# Patient Record
Sex: Female | Born: 1950
Health system: Southern US, Community
[De-identification: ages and names within clinical notes are randomized; demographics above are authoritative.]

## PROBLEM LIST (undated history)

## (undated) DIAGNOSIS — G43909 Migraine, unspecified, not intractable, without status migrainosus: Secondary | ICD-10-CM

## (undated) DIAGNOSIS — F419 Anxiety disorder, unspecified: Secondary | ICD-10-CM

## (undated) DIAGNOSIS — Z780 Asymptomatic menopausal state: Secondary | ICD-10-CM

## (undated) DIAGNOSIS — K5790 Diverticulosis of intestine, part unspecified, without perforation or abscess without bleeding: Secondary | ICD-10-CM

## (undated) HISTORY — DX: Anxiety disorder, unspecified: F41.9

## (undated) HISTORY — DX: Diverticulosis of intestine, part unspecified, without perforation or abscess without bleeding: K57.90

## (undated) HISTORY — DX: Migraine, unspecified, not intractable, without status migrainosus: G43.909

## (undated) HISTORY — DX: Asymptomatic menopausal state: Z78.0

---

## 1998-08-20 ENCOUNTER — Other Ambulatory Visit: Admission: RE | Admit: 1998-08-20 | Discharge: 1998-08-20 | Payer: Self-pay | Admitting: Gynecology

## 1998-10-09 ENCOUNTER — Other Ambulatory Visit: Admission: RE | Admit: 1998-10-09 | Discharge: 1998-10-09 | Payer: Self-pay | Admitting: Gynecology

## 2000-03-01 ENCOUNTER — Other Ambulatory Visit: Admission: RE | Admit: 2000-03-01 | Discharge: 2000-03-01 | Payer: Self-pay | Admitting: Gynecology

## 2000-03-15 ENCOUNTER — Encounter (INDEPENDENT_AMBULATORY_CARE_PROVIDER_SITE_OTHER): Payer: Self-pay | Admitting: Specialist

## 2000-03-15 ENCOUNTER — Other Ambulatory Visit: Admission: RE | Admit: 2000-03-15 | Discharge: 2000-03-15 | Payer: Self-pay | Admitting: Gynecology

## 2000-09-16 ENCOUNTER — Encounter (INDEPENDENT_AMBULATORY_CARE_PROVIDER_SITE_OTHER): Payer: Self-pay

## 2000-09-16 ENCOUNTER — Ambulatory Visit (HOSPITAL_COMMUNITY): Admission: RE | Admit: 2000-09-16 | Discharge: 2000-09-16 | Payer: Self-pay | Admitting: Gynecology

## 2000-10-24 ENCOUNTER — Encounter (INDEPENDENT_AMBULATORY_CARE_PROVIDER_SITE_OTHER): Payer: Self-pay | Admitting: Specialist

## 2000-10-24 ENCOUNTER — Inpatient Hospital Stay (HOSPITAL_COMMUNITY): Admission: RE | Admit: 2000-10-24 | Discharge: 2000-10-25 | Payer: Self-pay | Admitting: Gynecology

## 2002-02-14 ENCOUNTER — Emergency Department (HOSPITAL_COMMUNITY): Admission: EM | Admit: 2002-02-14 | Discharge: 2002-02-14 | Payer: Self-pay | Admitting: Emergency Medicine

## 2004-06-22 ENCOUNTER — Other Ambulatory Visit: Admission: RE | Admit: 2004-06-22 | Discharge: 2004-06-22 | Payer: Self-pay | Admitting: Gynecology

## 2004-11-01 ENCOUNTER — Emergency Department (HOSPITAL_COMMUNITY): Admission: EM | Admit: 2004-11-01 | Discharge: 2004-11-01 | Payer: Self-pay | Admitting: Emergency Medicine

## 2005-11-12 ENCOUNTER — Emergency Department (HOSPITAL_COMMUNITY): Admission: EM | Admit: 2005-11-12 | Discharge: 2005-11-12 | Payer: Self-pay | Admitting: Family Medicine

## 2005-12-21 ENCOUNTER — Ambulatory Visit: Admission: RE | Admit: 2005-12-21 | Discharge: 2005-12-21 | Payer: Self-pay | Admitting: Internal Medicine

## 2006-04-21 ENCOUNTER — Encounter: Admission: RE | Admit: 2006-04-21 | Discharge: 2006-04-21 | Payer: Self-pay | Admitting: Gynecology

## 2008-09-07 ENCOUNTER — Emergency Department (HOSPITAL_COMMUNITY): Admission: EM | Admit: 2008-09-07 | Discharge: 2008-09-07 | Payer: Self-pay | Admitting: Emergency Medicine

## 2008-12-12 ENCOUNTER — Ambulatory Visit (HOSPITAL_COMMUNITY): Admission: RE | Admit: 2008-12-12 | Discharge: 2008-12-12 | Payer: Self-pay | Admitting: Internal Medicine

## 2010-11-10 LAB — DIFFERENTIAL
Basophils Relative: 2 % — ABNORMAL HIGH (ref 0–1)
Eosinophils Relative: 1 % (ref 0–5)
Lymphocytes Relative: 21 % (ref 12–46)
Neutro Abs: 4.5 10*3/uL (ref 1.7–7.7)
Neutrophils Relative %: 69 % (ref 43–77)

## 2010-11-10 LAB — COMPREHENSIVE METABOLIC PANEL
ALT: 13 U/L (ref 0–35)
Albumin: 3.7 g/dL (ref 3.5–5.2)
Chloride: 104 mEq/L (ref 96–112)
Creatinine, Ser: 0.96 mg/dL (ref 0.4–1.2)
GFR calc Af Amer: >60 mL/min (ref >60)
Total Protein: 6.6 g/dL (ref 6.0–8.3)

## 2010-11-10 LAB — CBC
Platelets: 223 10*3/uL (ref 150–400)
RBC: 4.54 MIL/uL (ref 3.87–5.11)
RDW: 13.3 % (ref 11.5–15.5)

## 2010-12-18 NOTE — H&P (Signed)
[facility]  Patient:    Julie Kaiser, Julie Kaiser                     MRN: [id number] Adm. Date:  [id number] Disc. Date: [id number] Attending:  Susa Raring                         History and Physical  HISTORY:                      This patient is a 60 year old, gravida 2, para 2, admitted to the hospital because of persistent heavy vaginal bleeding and anemia.  This first started the summer of 2001, approximately.  She had an endometrial biopsy which was benign.  She was given Ponstel with little success.  She was also tried on oral hormonal therapy.  This was unsuccessful. She became anemic, was placed on iron, and she responded.  However, in January and February of this year, the bleeding became worse, and she is scheduled for a D&C and hysteroscopy.  This was performed.  She was found to have endometrial polyps and also what looked like an intracavitary leiomyoma.  She was again placed on oral contraceptives after the [facility]; however, she continued to have very heavy periods.  She was given Lupron which finally stopped her bleeding.  She is now admitted for definitive therapy of this problem.  Pap smear is normal.  Last mammogram was normal.  Last menstrual period prior to this admission was September 29, 2000.  Since the patient is nearly 60 years old, she has also elected to have her ovaries removed at the same time and go on estrogen replacement therapy indefinitely.  PAST MEDICAL HISTORY:         The patient had the usual childhood diseases. She had two pregnancies which went to term.  She also has one adopted child. 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  She had a postpartum tubal after her last pregnancy.  She denies drug use, minimal alcohol use.  ALLERGIES:                    TETRACYCLINE.  No other allergies.  FAMILY HISTORY:               There is no history of bleeding disorders; tuberculosis; colon, breast, or ovarian  cancers.  SYSTEM REVIEW:                ENT:  No complaints.  CARDIORESPIRATORY:  No complaints.  GI:  No complaints.  GENITOURINARY:  See present illness. NEUROMUSCULAR:  No complaints.  PHYSICAL EXAMINATION:  GENERAL:                      This is a well-nourished, well-developed 60 year old female in no acute distress.  She is 5 feet 6 inches tall, weighs approximately 160 lbs.  VITAL SIGNS:                  Blood pressure 100/70, pulse 70 and regular, respirations 12 per minute.  HEAD/NECK:                    Pupils equal, reactive to light and accommodation.  EARS/NOSE/THROAT:             Normal thyroids, normal size.  HEART:  Rhythm regular.  No murmurs heard.  LUNGS:                        Clear to auscultation and percussion.  BREASTS:                      No palpable masses.  ABDOMEN:                      Soft and flat.  No organs are palpable.  There is no tenderness.  Bowel sounds are present.  SKIN:                         Smooth and dry.  EXTREMITIES:                  No deformities.  No limitation of motion. Peripheral pulses are present bilaterally.  NEUROLOGIC:                   Physiologic.  PELVIC:                       External genitalia normal with a parous outlet. Vagina is lined with healthy mucosa.  Bladder and rectum are pretty well supported.  Cervix is smooth and well supported.  The uterus itself is anterior.  It is slightly enlarged at 8-10 weeks size.  No adnexal masses can be palpated.  The cul-de-sac is free of masses.  RECTAL:                       Negative.  Hemoccult negative.  IMPRESSION:                   1. Uterine myomata with intracavitary leiomyoma.                               2. Menorrhagia.                               3. Anemia secondary to blood loss or postpartum                                  status of bilateral tubal sterilization.  PLAN AND MANAGEMENT:          The patient will go to the  operating room where a total abdominal hysterectomy and bilateral salpingo-oophorectomy will be performed.  She will be put on hormonal replacement therapy following that. The patient understands that she will no longer have periods and no longer be able to have children.  She understands the hazards of pelvic surgery, including, but not limited to, bleeding, infection, injure to adjacent structures, and death.  The patient agrees to undergo this procedure. DD:  10/24/00 TD:  10/24/00 Job: [id number]

## 2010-12-18 NOTE — Op Note (Signed)
[facility]  Patient:    Julie Kaiser, Julie Kaiser                       MRN: [id number] Proc. Date: 09/16/00 Attending:  Luvenia Redden, M.D.                           Operative Report  PREOPERATIVE DIAGNOSES: 1. Menorrhagia. 2. Metrorrhagia. 3. Enlarged uterus probably due to fibroids.  POSTOPERATIVE DIAGNOSES: 1. Menorrhagia. 2. Metrorrhagia. 3. Enlarged uterus probably due to fibroids. 4. Endometrial polyps. 5. Intracavitary leiomyoma.  OPERATION:  Hysteroscopy, dilatation and curettage.  SURGEON:  Luvenia Redden, M.D.  DESCRIPTION OF PROCEDURE:   The patient was placed in the lithotomy position and given IV sedation.  Paracervical block was performed by injecting 1% lidocaine at the 4 and 8 oclock positions paracervically.  Examination revealed the uterus to be anterior and about the size of a 10-12 week pregnancy.  No adnexal masses could be palpated.  The uterus sounded to a depth of 5 inches.  The cervix was dilated, and the scope inserted into the endocervical canal.  Using sorbitol as a distending medium, the endocervical canal appeared normal as it was viewed.  The scope was advanced and upon entering the endometrial cavity, there was a fair amount of blood.  This was eventually evacuated and washed free.  Contamination showed multiple endometrial polyps extending close to the posterior and anterior wall.  There was also what appeared to be a large intracavitary leiomyoma present directly towards the fundus of the uterus and as near as I could tell this was emanating from the fundus.  This was fairly large and I could only estimate it being anywhere from 3-4 cm in diameter.  Since the patient had not been pretreated with Lupron, resection of the myoma was not attempted.  The scope was withdrawn, and the cervical curettage was done, and this was sent as a separate specimen.  Thorough endometrial curettage was done.  There was a fair amount  of polypoid appearing material obtained.  The endometrial cavity was wiped with a dry sponge.  The procedure was terminated.  There was minimal fluid deficit.  Approximately 60 cc was reported, however, there was at least that much fluid on the floor.  Blood loss was minimal, and the patient tolerated the procedure well.  She was removed to recovery in good condition. DD:  09/16/00 TD:  09/16/00 Job: [id number]

## 2010-12-18 NOTE — Discharge Summary (Signed)
[facility] of [facility]  Patient:    Julie Kaiser, Julie Kaiser                     MRN: [id number] Adm. Date:  [id number] Disc. Date: [id number] Attending:  Susa Raring                           Discharge Summary                                Patient is a 60 year old female admitted to the hospital because of heavy periods and a uterine myoma that is unresponsive to hormonal therapy and a D&C.  She became anemic because of her bleeding.  She was given Lupron and put on iron.  The patient is now admitted for a hysterectomy for definitive treatment.  PHYSICAL EXAMINATION  PELVIC:                       External genitalia was normal.  She had a ______ outlet.  The vagina was lined with healthy mucosa.  The bladder and rectum were well supported.  The cervix was smooth and well supported.  Uterus is anterior and slightly enlarged and irregular at 8-[redacted] weeks gestational size.  No adnexal masses could be palpated and the cul-de-sac was free of masses.  LABORATORIES:                 Preoperative hemoglobin 10.2, hematocrit 32, white count and differential were within normal limits.  Postoperative hemoglobin 8.5, hematocrit 26.9.  Urinalysis normal for a voided specimen. Pregnancy test was negative.  HOSPITAL COURSE:              Patient was taken to the operating room where a total abdominal hysterectomy, bilateral salpingo-oophorectomy was done without complications.  Blood loss was about 100 cc.  None was replaced.  Patient tolerated the procedure well.  First postoperative day she was afebrile.  She had stable vital signs.  The catheter was removed and she was able to void spontaneously March 26.  Later on that same day the patient was ambulating, passing gas, and taking p.o. nourishment.  The staples were removed from her wound and Steri-Strips were applied.  She was instructed to do limited activity, no vaginal penetration, and to return to my office in three  weeks time for followup care.  She was given a prescription for [facility] for pain and Premarin 0.625 mg to be taken daily.  Examination of the tissue removed showed that she had some intramural and subserosal leiomyomata, multiple ones.  She also had some endosalpingiosis of the tubes.  There was no malignancy.  FINAL DIAGNOSES:              1. Multiple uterine myomata.                               2. Menorrhagia.                               3. Anemia secondary to blood loss.                               4. Endosalpingiosis.  PROCEDURE:  Total abdominal hysterectomy, bilateral salpingo-oophorectomy.  CONDITION ON DISCHARGE:       Improved. DD:  12/13/00 TD:  12/13/00 Job: [id number]

## 2010-12-18 NOTE — Op Note (Signed)
[facility]  Patient:    Julie Kaiser, Julie Kaiser                     MRN: [id number] Proc. Date: 10/24/00 Adm. Date:  [id number] Attending:  Susa Raring                           Operative Report  PREOPERATIVE DIAGNOSES:       1. Leiomyomata of uterus.                               2. Anemia.  POSTOPERATIVE DIAGNOSES:      1. Leiomyomata of uterus.                               2. Anemia.  OPERATION/PROCEDURE:          Total abdominal hysterectomy with bilateral salpingo-oophorectomy.  SURGEON:                      Luvenia Redden, M.D.  ASSISTANT:                    Gerrit Friends. Aldona Bar, M.D.  DESCRIPTION OF PROCEDURE:     With the patient under good anesthesia the patient was prepped and draped in sterile manner.  A Foley catheter was placed in the bladder.  A Pfannenstiel incision was made and bleeders were clamped and cut as encountered.  Bleeders were cauterized.  The fascia was opened transversely and the rectus muscles separated in the midline.  The peritoneum was entered sharply and incised vertically so as to enter the pelvic and abdominal cavity.  Examination of upper abdominal organs showed a smooth liver, the gallbladder without any stones, no kidney masses and normal kidney size, and no masses palpable in the upper abdomen.  A retractor was placed into the incision and intestines packed off into the upper abdomen with wet lap packs.  The pelvic organs were viewed and myomata was present in the uterus, relatively small but there were multiple ones.  The ovaries appeared small and normal.  The tubes showed previous tubal sterilization.  There were no other abnormalities in the pelvis.  The round ligaments were clamped and cauterized and incised.  The bladder flap was developed by sharp and blunt dissection.  The infundibulopelvic ligament on the left was clamped and cut and then ligated doubly with Monocryl.  The infundibulopelvic ligament  on the right was clamped and cauterized and divided and then the stump was suture ligated.  The uterine vessels were skeletonized.  The broad ligaments including the uterine vessels were clamped and cauterized and cut, this done in two bites.  The cardinal ligaments were cauterized and incised.  The uterosacral ligaments were clamped and cut and suture ligated.  The vagina was entered anteriorly and the cervix circumscribed and the specimen removed.  The lateral vaginal apices were secured with figure-of-eight 0 Monocryl and the cuff was closed with figure-of-eight Monocryl sutures.  The pelvis was irrigated copiously with saline and it was aspirated.  There was no active bleeding in the pelvis.  Both ureters were viewed throughout the pelvis and seemed to be functioning normally.  The packs were removed and the appendix was inspected and appeared to be normal.  The retractor was removed.  After all the counts were correct the peritoneum was closed with continuous 2-0 Monocryl.  The rectus muscles were approximated with continuous 2-0 Monocryl and the fascia repaired with continuous 0 Monocryl.  Subcutaneous tissues were approximated with interrupted 2-0 plain catgut and the skin edges approximated with wide skin staples.  A dry sterile dressing was applied.  Estimated blood loss was 100 cc, none was replaced.  There was clear urine coming from the catheter at the end of the procedure.  The patient tolerated the procedure well and was removed to the recovery room in good condition. DD:  10/24/00 TD:  10/25/00 Job: [id number]

## 2013-01-24 ENCOUNTER — Ambulatory Visit: Payer: Self-pay | Admitting: Neurology

## 2013-12-01 ENCOUNTER — Encounter: Payer: Self-pay | Admitting: *Deleted

## 2016-07-12 DIAGNOSIS — E663 Overweight: Secondary | ICD-10-CM | POA: Diagnosis not present

## 2016-08-20 DIAGNOSIS — Z01419 Encounter for gynecological examination (general) (routine) without abnormal findings: Secondary | ICD-10-CM | POA: Diagnosis not present

## 2016-08-20 DIAGNOSIS — Z1231 Encounter for screening mammogram for malignant neoplasm of breast: Secondary | ICD-10-CM | POA: Diagnosis not present

## 2016-09-29 DIAGNOSIS — R69 Illness, unspecified: Secondary | ICD-10-CM | POA: Diagnosis not present

## 2016-09-29 DIAGNOSIS — E663 Overweight: Secondary | ICD-10-CM | POA: Diagnosis not present

## 2016-10-25 DIAGNOSIS — E663 Overweight: Secondary | ICD-10-CM | POA: Diagnosis not present

## 2016-10-25 DIAGNOSIS — R69 Illness, unspecified: Secondary | ICD-10-CM | POA: Diagnosis not present

## 2016-11-03 DIAGNOSIS — H903 Sensorineural hearing loss, bilateral: Secondary | ICD-10-CM | POA: Diagnosis not present

## 2016-11-03 DIAGNOSIS — H6982 Other specified disorders of Eustachian tube, left ear: Secondary | ICD-10-CM | POA: Diagnosis not present

## 2016-11-03 DIAGNOSIS — H9202 Otalgia, left ear: Secondary | ICD-10-CM | POA: Diagnosis not present

## 2016-11-22 DIAGNOSIS — M25561 Pain in right knee: Secondary | ICD-10-CM | POA: Diagnosis not present

## 2016-11-22 DIAGNOSIS — M25551 Pain in right hip: Secondary | ICD-10-CM | POA: Diagnosis not present

## 2016-11-30 DIAGNOSIS — M65811 Other synovitis and tenosynovitis, right shoulder: Secondary | ICD-10-CM | POA: Diagnosis not present

## 2016-11-30 DIAGNOSIS — M25461 Effusion, right knee: Secondary | ICD-10-CM | POA: Diagnosis not present

## 2016-11-30 DIAGNOSIS — M1711 Unilateral primary osteoarthritis, right knee: Secondary | ICD-10-CM | POA: Diagnosis not present

## 2016-11-30 DIAGNOSIS — M25561 Pain in right knee: Secondary | ICD-10-CM | POA: Diagnosis not present

## 2016-11-30 DIAGNOSIS — M23351 Other meniscus derangements, posterior horn of lateral meniscus, right knee: Secondary | ICD-10-CM | POA: Diagnosis not present

## 2016-12-06 DIAGNOSIS — S83249A Other tear of medial meniscus, current injury, unspecified knee, initial encounter: Secondary | ICD-10-CM | POA: Diagnosis not present

## 2016-12-23 DIAGNOSIS — Z882 Allergy status to sulfonamides status: Secondary | ICD-10-CM | POA: Diagnosis not present

## 2016-12-23 DIAGNOSIS — Z881 Allergy status to other antibiotic agents status: Secondary | ICD-10-CM | POA: Diagnosis not present

## 2016-12-23 DIAGNOSIS — Z888 Allergy status to other drugs, medicaments and biological substances status: Secondary | ICD-10-CM | POA: Diagnosis not present

## 2016-12-23 DIAGNOSIS — G8918 Other acute postprocedural pain: Secondary | ICD-10-CM | POA: Diagnosis not present

## 2016-12-23 DIAGNOSIS — S83271A Complex tear of lateral meniscus, current injury, right knee, initial encounter: Secondary | ICD-10-CM | POA: Diagnosis not present

## 2016-12-23 DIAGNOSIS — R69 Illness, unspecified: Secondary | ICD-10-CM | POA: Diagnosis not present

## 2016-12-23 DIAGNOSIS — M2241 Chondromalacia patellae, right knee: Secondary | ICD-10-CM | POA: Diagnosis not present

## 2016-12-23 DIAGNOSIS — S83231A Complex tear of medial meniscus, current injury, right knee, initial encounter: Secondary | ICD-10-CM | POA: Diagnosis not present

## 2016-12-23 DIAGNOSIS — X58XXXA Exposure to other specified factors, initial encounter: Secondary | ICD-10-CM | POA: Diagnosis not present

## 2016-12-23 DIAGNOSIS — M810 Age-related osteoporosis without current pathological fracture: Secondary | ICD-10-CM | POA: Diagnosis not present

## 2017-02-09 DIAGNOSIS — R69 Illness, unspecified: Secondary | ICD-10-CM | POA: Diagnosis not present

## 2017-02-09 DIAGNOSIS — Z6828 Body mass index (BMI) 28.0-28.9, adult: Secondary | ICD-10-CM | POA: Diagnosis not present

## 2017-02-09 DIAGNOSIS — E663 Overweight: Secondary | ICD-10-CM | POA: Diagnosis not present

## 2017-03-16 DIAGNOSIS — R69 Illness, unspecified: Secondary | ICD-10-CM | POA: Diagnosis not present

## 2017-05-06 DIAGNOSIS — R51 Headache: Secondary | ICD-10-CM | POA: Diagnosis not present

## 2017-05-06 DIAGNOSIS — M810 Age-related osteoporosis without current pathological fracture: Secondary | ICD-10-CM | POA: Diagnosis not present

## 2017-05-06 DIAGNOSIS — Z1389 Encounter for screening for other disorder: Secondary | ICD-10-CM | POA: Diagnosis not present

## 2017-05-06 DIAGNOSIS — E663 Overweight: Secondary | ICD-10-CM | POA: Diagnosis not present

## 2017-05-06 DIAGNOSIS — G4700 Insomnia, unspecified: Secondary | ICD-10-CM | POA: Diagnosis not present

## 2017-05-06 DIAGNOSIS — R69 Illness, unspecified: Secondary | ICD-10-CM | POA: Diagnosis not present

## 2017-05-06 DIAGNOSIS — Z0000 Encounter for general adult medical examination without abnormal findings: Secondary | ICD-10-CM | POA: Diagnosis not present

## 2017-05-06 DIAGNOSIS — Z23 Encounter for immunization: Secondary | ICD-10-CM | POA: Diagnosis not present

## 2017-07-01 DIAGNOSIS — M25561 Pain in right knee: Secondary | ICD-10-CM | POA: Diagnosis not present

## 2017-07-19 DIAGNOSIS — M25561 Pain in right knee: Secondary | ICD-10-CM | POA: Diagnosis not present

## 2017-07-19 DIAGNOSIS — M1711 Unilateral primary osteoarthritis, right knee: Secondary | ICD-10-CM | POA: Diagnosis not present

## 2017-07-27 DIAGNOSIS — M1711 Unilateral primary osteoarthritis, right knee: Secondary | ICD-10-CM | POA: Diagnosis not present

## 2017-08-04 DIAGNOSIS — M1711 Unilateral primary osteoarthritis, right knee: Secondary | ICD-10-CM | POA: Diagnosis not present

## 2017-08-04 DIAGNOSIS — M25561 Pain in right knee: Secondary | ICD-10-CM | POA: Diagnosis not present

## 2017-08-16 DIAGNOSIS — M810 Age-related osteoporosis without current pathological fracture: Secondary | ICD-10-CM | POA: Diagnosis not present

## 2017-08-16 DIAGNOSIS — R69 Illness, unspecified: Secondary | ICD-10-CM | POA: Diagnosis not present

## 2017-09-14 DIAGNOSIS — M25561 Pain in right knee: Secondary | ICD-10-CM | POA: Diagnosis not present

## 2017-09-21 DIAGNOSIS — R69 Illness, unspecified: Secondary | ICD-10-CM | POA: Diagnosis not present

## 2017-09-28 DIAGNOSIS — E669 Obesity, unspecified: Secondary | ICD-10-CM | POA: Diagnosis not present

## 2017-09-28 DIAGNOSIS — K5909 Other constipation: Secondary | ICD-10-CM | POA: Diagnosis not present

## 2017-09-28 DIAGNOSIS — R69 Illness, unspecified: Secondary | ICD-10-CM | POA: Diagnosis not present

## 2017-09-28 DIAGNOSIS — Z6830 Body mass index (BMI) 30.0-30.9, adult: Secondary | ICD-10-CM | POA: Diagnosis not present

## 2017-10-04 DIAGNOSIS — M810 Age-related osteoporosis without current pathological fracture: Secondary | ICD-10-CM | POA: Diagnosis not present

## 2017-12-29 DIAGNOSIS — R197 Diarrhea, unspecified: Secondary | ICD-10-CM | POA: Diagnosis not present

## 2017-12-29 DIAGNOSIS — R350 Frequency of micturition: Secondary | ICD-10-CM | POA: Diagnosis not present

## 2018-01-09 DIAGNOSIS — R350 Frequency of micturition: Secondary | ICD-10-CM | POA: Diagnosis not present

## 2018-01-09 DIAGNOSIS — R109 Unspecified abdominal pain: Secondary | ICD-10-CM | POA: Diagnosis not present

## 2018-01-12 DIAGNOSIS — R350 Frequency of micturition: Secondary | ICD-10-CM | POA: Diagnosis not present

## 2018-02-06 DIAGNOSIS — R350 Frequency of micturition: Secondary | ICD-10-CM | POA: Diagnosis not present

## 2018-02-09 DIAGNOSIS — N390 Urinary tract infection, site not specified: Secondary | ICD-10-CM | POA: Diagnosis not present

## 2018-02-09 DIAGNOSIS — Z8249 Family history of ischemic heart disease and other diseases of the circulatory system: Secondary | ICD-10-CM | POA: Diagnosis not present

## 2018-02-09 DIAGNOSIS — G8929 Other chronic pain: Secondary | ICD-10-CM | POA: Diagnosis not present

## 2018-02-09 DIAGNOSIS — Z809 Family history of malignant neoplasm, unspecified: Secondary | ICD-10-CM | POA: Diagnosis not present

## 2018-02-09 DIAGNOSIS — M810 Age-related osteoporosis without current pathological fracture: Secondary | ICD-10-CM | POA: Diagnosis not present

## 2018-02-09 DIAGNOSIS — F419 Anxiety disorder, unspecified: Secondary | ICD-10-CM | POA: Diagnosis not present

## 2018-02-09 DIAGNOSIS — R69 Illness, unspecified: Secondary | ICD-10-CM | POA: Diagnosis not present

## 2018-02-13 DIAGNOSIS — M810 Age-related osteoporosis without current pathological fracture: Secondary | ICD-10-CM | POA: Diagnosis not present

## 2018-02-13 DIAGNOSIS — R69 Illness, unspecified: Secondary | ICD-10-CM | POA: Diagnosis not present

## 2018-03-13 DIAGNOSIS — N952 Postmenopausal atrophic vaginitis: Secondary | ICD-10-CM | POA: Diagnosis not present

## 2018-03-13 DIAGNOSIS — N3941 Urge incontinence: Secondary | ICD-10-CM | POA: Diagnosis not present

## 2018-03-13 DIAGNOSIS — N390 Urinary tract infection, site not specified: Secondary | ICD-10-CM | POA: Diagnosis not present

## 2018-03-21 DIAGNOSIS — R69 Illness, unspecified: Secondary | ICD-10-CM | POA: Diagnosis not present

## 2018-04-07 DIAGNOSIS — R69 Illness, unspecified: Secondary | ICD-10-CM | POA: Diagnosis not present

## 2018-04-07 DIAGNOSIS — F3341 Major depressive disorder, recurrent, in partial remission: Secondary | ICD-10-CM | POA: Diagnosis not present

## 2018-04-07 DIAGNOSIS — M810 Age-related osteoporosis without current pathological fracture: Secondary | ICD-10-CM | POA: Diagnosis not present

## 2018-04-15 DIAGNOSIS — L929 Granulomatous disorder of the skin and subcutaneous tissue, unspecified: Secondary | ICD-10-CM | POA: Diagnosis not present

## 2018-04-24 DIAGNOSIS — N3941 Urge incontinence: Secondary | ICD-10-CM | POA: Diagnosis not present

## 2018-04-24 DIAGNOSIS — N3946 Mixed incontinence: Secondary | ICD-10-CM | POA: Diagnosis not present

## 2018-04-24 DIAGNOSIS — N390 Urinary tract infection, site not specified: Secondary | ICD-10-CM | POA: Diagnosis not present

## 2018-04-24 DIAGNOSIS — N952 Postmenopausal atrophic vaginitis: Secondary | ICD-10-CM | POA: Diagnosis not present

## 2018-04-24 DIAGNOSIS — Z1231 Encounter for screening mammogram for malignant neoplasm of breast: Secondary | ICD-10-CM | POA: Diagnosis not present

## 2018-04-24 DIAGNOSIS — M810 Age-related osteoporosis without current pathological fracture: Secondary | ICD-10-CM | POA: Diagnosis not present

## 2018-06-02 DIAGNOSIS — Z23 Encounter for immunization: Secondary | ICD-10-CM | POA: Diagnosis not present

## 2018-06-02 DIAGNOSIS — R69 Illness, unspecified: Secondary | ICD-10-CM | POA: Diagnosis not present

## 2018-06-02 DIAGNOSIS — Z0000 Encounter for general adult medical examination without abnormal findings: Secondary | ICD-10-CM | POA: Diagnosis not present

## 2018-06-02 DIAGNOSIS — Z1389 Encounter for screening for other disorder: Secondary | ICD-10-CM | POA: Diagnosis not present

## 2018-06-02 DIAGNOSIS — M2550 Pain in unspecified joint: Secondary | ICD-10-CM | POA: Diagnosis not present

## 2018-06-02 DIAGNOSIS — E663 Overweight: Secondary | ICD-10-CM | POA: Diagnosis not present

## 2018-06-02 DIAGNOSIS — M810 Age-related osteoporosis without current pathological fracture: Secondary | ICD-10-CM | POA: Diagnosis not present

## 2018-07-03 DIAGNOSIS — M5136 Other intervertebral disc degeneration, lumbar region: Secondary | ICD-10-CM | POA: Diagnosis not present

## 2018-07-03 DIAGNOSIS — G8929 Other chronic pain: Secondary | ICD-10-CM | POA: Diagnosis not present

## 2018-07-03 DIAGNOSIS — R768 Other specified abnormal immunological findings in serum: Secondary | ICD-10-CM | POA: Diagnosis not present

## 2018-07-03 DIAGNOSIS — M4186 Other forms of scoliosis, lumbar region: Secondary | ICD-10-CM | POA: Diagnosis not present

## 2018-07-03 DIAGNOSIS — M545 Low back pain: Secondary | ICD-10-CM | POA: Diagnosis not present

## 2018-07-03 DIAGNOSIS — M7062 Trochanteric bursitis, left hip: Secondary | ICD-10-CM | POA: Diagnosis not present

## 2018-07-03 DIAGNOSIS — M4316 Spondylolisthesis, lumbar region: Secondary | ICD-10-CM | POA: Diagnosis not present

## 2018-07-03 DIAGNOSIS — M150 Primary generalized (osteo)arthritis: Secondary | ICD-10-CM | POA: Diagnosis not present

## 2018-07-03 DIAGNOSIS — M7061 Trochanteric bursitis, right hip: Secondary | ICD-10-CM | POA: Diagnosis not present

## 2018-07-03 DIAGNOSIS — M47896 Other spondylosis, lumbar region: Secondary | ICD-10-CM | POA: Diagnosis not present

## 2018-07-20 DIAGNOSIS — M810 Age-related osteoporosis without current pathological fracture: Secondary | ICD-10-CM | POA: Diagnosis not present

## 2018-07-20 DIAGNOSIS — F3341 Major depressive disorder, recurrent, in partial remission: Secondary | ICD-10-CM | POA: Diagnosis not present

## 2018-07-20 DIAGNOSIS — R69 Illness, unspecified: Secondary | ICD-10-CM | POA: Diagnosis not present

## 2018-07-28 DIAGNOSIS — G44209 Tension-type headache, unspecified, not intractable: Secondary | ICD-10-CM | POA: Diagnosis not present

## 2018-08-02 DIAGNOSIS — Z881 Allergy status to other antibiotic agents status: Secondary | ICD-10-CM | POA: Diagnosis not present

## 2018-08-02 DIAGNOSIS — R079 Chest pain, unspecified: Secondary | ICD-10-CM | POA: Diagnosis not present

## 2018-08-02 DIAGNOSIS — R69 Illness, unspecified: Secondary | ICD-10-CM | POA: Diagnosis not present

## 2018-08-02 DIAGNOSIS — Z888 Allergy status to other drugs, medicaments and biological substances status: Secondary | ICD-10-CM | POA: Diagnosis not present

## 2018-08-02 DIAGNOSIS — R0789 Other chest pain: Secondary | ICD-10-CM | POA: Diagnosis not present

## 2018-08-02 DIAGNOSIS — Z882 Allergy status to sulfonamides status: Secondary | ICD-10-CM | POA: Diagnosis not present

## 2018-08-02 DIAGNOSIS — Z79899 Other long term (current) drug therapy: Secondary | ICD-10-CM | POA: Diagnosis not present

## 2018-08-02 DIAGNOSIS — R0602 Shortness of breath: Secondary | ICD-10-CM | POA: Diagnosis not present

## 2018-08-02 DIAGNOSIS — M546 Pain in thoracic spine: Secondary | ICD-10-CM | POA: Diagnosis not present

## 2018-08-02 DIAGNOSIS — M549 Dorsalgia, unspecified: Secondary | ICD-10-CM | POA: Diagnosis not present

## 2018-08-02 DIAGNOSIS — Z883 Allergy status to other anti-infective agents status: Secondary | ICD-10-CM | POA: Diagnosis not present

## 2018-08-03 DIAGNOSIS — M810 Age-related osteoporosis without current pathological fracture: Secondary | ICD-10-CM | POA: Diagnosis not present

## 2018-08-03 DIAGNOSIS — R69 Illness, unspecified: Secondary | ICD-10-CM | POA: Diagnosis not present

## 2018-08-03 DIAGNOSIS — F3341 Major depressive disorder, recurrent, in partial remission: Secondary | ICD-10-CM | POA: Diagnosis not present

## 2018-09-13 DIAGNOSIS — R768 Other specified abnormal immunological findings in serum: Secondary | ICD-10-CM | POA: Diagnosis not present

## 2018-11-09 DIAGNOSIS — R69 Illness, unspecified: Secondary | ICD-10-CM | POA: Diagnosis not present

## 2018-11-09 DIAGNOSIS — M810 Age-related osteoporosis without current pathological fracture: Secondary | ICD-10-CM | POA: Diagnosis not present

## 2018-11-29 DIAGNOSIS — R69 Illness, unspecified: Secondary | ICD-10-CM | POA: Diagnosis not present

## 2018-12-06 DIAGNOSIS — R69 Illness, unspecified: Secondary | ICD-10-CM | POA: Diagnosis not present

## 2018-12-13 DIAGNOSIS — R69 Illness, unspecified: Secondary | ICD-10-CM | POA: Diagnosis not present

## 2018-12-20 DIAGNOSIS — R69 Illness, unspecified: Secondary | ICD-10-CM | POA: Diagnosis not present

## 2019-01-03 DIAGNOSIS — R69 Illness, unspecified: Secondary | ICD-10-CM | POA: Diagnosis not present

## 2019-01-10 DIAGNOSIS — R69 Illness, unspecified: Secondary | ICD-10-CM | POA: Diagnosis not present

## 2019-01-24 DIAGNOSIS — R69 Illness, unspecified: Secondary | ICD-10-CM | POA: Diagnosis not present

## 2019-01-31 DIAGNOSIS — M5442 Lumbago with sciatica, left side: Secondary | ICD-10-CM | POA: Diagnosis not present

## 2019-01-31 DIAGNOSIS — K5792 Diverticulitis of intestine, part unspecified, without perforation or abscess without bleeding: Secondary | ICD-10-CM | POA: Diagnosis not present

## 2019-02-07 DIAGNOSIS — R69 Illness, unspecified: Secondary | ICD-10-CM | POA: Diagnosis not present

## 2019-02-11 DIAGNOSIS — Z20828 Contact with and (suspected) exposure to other viral communicable diseases: Secondary | ICD-10-CM | POA: Diagnosis not present

## 2019-02-17 DIAGNOSIS — Z20828 Contact with and (suspected) exposure to other viral communicable diseases: Secondary | ICD-10-CM | POA: Diagnosis not present

## 2019-02-21 DIAGNOSIS — U071 COVID-19: Secondary | ICD-10-CM | POA: Diagnosis not present

## 2019-02-21 DIAGNOSIS — W57XXXA Bitten or stung by nonvenomous insect and other nonvenomous arthropods, initial encounter: Secondary | ICD-10-CM | POA: Diagnosis not present

## 2019-02-21 DIAGNOSIS — Z23 Encounter for immunization: Secondary | ICD-10-CM | POA: Diagnosis not present

## 2019-02-21 DIAGNOSIS — Z882 Allergy status to sulfonamides status: Secondary | ICD-10-CM | POA: Diagnosis not present

## 2019-02-21 DIAGNOSIS — Z79899 Other long term (current) drug therapy: Secondary | ICD-10-CM | POA: Diagnosis not present

## 2019-02-21 DIAGNOSIS — Z881 Allergy status to other antibiotic agents status: Secondary | ICD-10-CM | POA: Diagnosis not present

## 2019-02-21 DIAGNOSIS — Z2914 Encounter for prophylactic rabies immune globin: Secondary | ICD-10-CM | POA: Diagnosis not present

## 2019-02-21 DIAGNOSIS — Z203 Contact with and (suspected) exposure to rabies: Secondary | ICD-10-CM | POA: Diagnosis not present

## 2019-02-21 DIAGNOSIS — Z888 Allergy status to other drugs, medicaments and biological substances status: Secondary | ICD-10-CM | POA: Diagnosis not present

## 2019-02-21 DIAGNOSIS — Z20828 Contact with and (suspected) exposure to other viral communicable diseases: Secondary | ICD-10-CM | POA: Diagnosis not present

## 2019-02-24 DIAGNOSIS — Z203 Contact with and (suspected) exposure to rabies: Secondary | ICD-10-CM | POA: Diagnosis not present

## 2019-02-24 DIAGNOSIS — Z23 Encounter for immunization: Secondary | ICD-10-CM | POA: Diagnosis not present

## 2019-02-28 DIAGNOSIS — Z23 Encounter for immunization: Secondary | ICD-10-CM | POA: Diagnosis not present

## 2019-02-28 DIAGNOSIS — Z203 Contact with and (suspected) exposure to rabies: Secondary | ICD-10-CM | POA: Diagnosis not present

## 2019-03-07 DIAGNOSIS — Z203 Contact with and (suspected) exposure to rabies: Secondary | ICD-10-CM | POA: Diagnosis not present

## 2019-03-07 DIAGNOSIS — Z23 Encounter for immunization: Secondary | ICD-10-CM | POA: Diagnosis not present

## 2019-03-15 DIAGNOSIS — R69 Illness, unspecified: Secondary | ICD-10-CM | POA: Diagnosis not present

## 2019-03-15 DIAGNOSIS — F3341 Major depressive disorder, recurrent, in partial remission: Secondary | ICD-10-CM | POA: Diagnosis not present

## 2019-03-15 DIAGNOSIS — M810 Age-related osteoporosis without current pathological fracture: Secondary | ICD-10-CM | POA: Diagnosis not present

## 2019-04-12 DIAGNOSIS — Z1283 Encounter for screening for malignant neoplasm of skin: Secondary | ICD-10-CM | POA: Diagnosis not present

## 2019-04-12 DIAGNOSIS — L820 Inflamed seborrheic keratosis: Secondary | ICD-10-CM | POA: Diagnosis not present

## 2019-04-12 DIAGNOSIS — K1379 Other lesions of oral mucosa: Secondary | ICD-10-CM | POA: Diagnosis not present

## 2019-04-12 DIAGNOSIS — D485 Neoplasm of uncertain behavior of skin: Secondary | ICD-10-CM | POA: Diagnosis not present

## 2019-04-28 DIAGNOSIS — R69 Illness, unspecified: Secondary | ICD-10-CM | POA: Diagnosis not present

## 2019-05-08 DIAGNOSIS — M545 Low back pain: Secondary | ICD-10-CM | POA: Diagnosis not present

## 2019-05-08 DIAGNOSIS — R69 Illness, unspecified: Secondary | ICD-10-CM | POA: Diagnosis not present

## 2019-05-23 DIAGNOSIS — I951 Orthostatic hypotension: Secondary | ICD-10-CM | POA: Diagnosis not present

## 2019-05-23 DIAGNOSIS — M62830 Muscle spasm of back: Secondary | ICD-10-CM | POA: Diagnosis not present

## 2019-05-23 DIAGNOSIS — G8929 Other chronic pain: Secondary | ICD-10-CM | POA: Diagnosis not present

## 2019-05-23 DIAGNOSIS — R69 Illness, unspecified: Secondary | ICD-10-CM | POA: Diagnosis not present

## 2019-05-23 DIAGNOSIS — Z881 Allergy status to other antibiotic agents status: Secondary | ICD-10-CM | POA: Diagnosis not present

## 2019-05-23 DIAGNOSIS — G4700 Insomnia, unspecified: Secondary | ICD-10-CM | POA: Diagnosis not present

## 2019-05-23 DIAGNOSIS — F419 Anxiety disorder, unspecified: Secondary | ICD-10-CM | POA: Diagnosis not present

## 2019-05-23 DIAGNOSIS — E663 Overweight: Secondary | ICD-10-CM | POA: Diagnosis not present

## 2019-05-23 DIAGNOSIS — Z882 Allergy status to sulfonamides status: Secondary | ICD-10-CM | POA: Diagnosis not present

## 2019-06-12 DIAGNOSIS — Z20828 Contact with and (suspected) exposure to other viral communicable diseases: Secondary | ICD-10-CM | POA: Diagnosis not present

## 2019-06-19 DIAGNOSIS — R69 Illness, unspecified: Secondary | ICD-10-CM | POA: Diagnosis not present

## 2019-06-19 DIAGNOSIS — M810 Age-related osteoporosis without current pathological fracture: Secondary | ICD-10-CM | POA: Diagnosis not present

## 2019-06-19 DIAGNOSIS — F3341 Major depressive disorder, recurrent, in partial remission: Secondary | ICD-10-CM | POA: Diagnosis not present

## 2019-07-05 DIAGNOSIS — Z1389 Encounter for screening for other disorder: Secondary | ICD-10-CM | POA: Diagnosis not present

## 2019-07-05 DIAGNOSIS — R519 Headache, unspecified: Secondary | ICD-10-CM | POA: Diagnosis not present

## 2019-07-05 DIAGNOSIS — R69 Illness, unspecified: Secondary | ICD-10-CM | POA: Diagnosis not present

## 2019-07-05 DIAGNOSIS — Z5181 Encounter for therapeutic drug level monitoring: Secondary | ICD-10-CM | POA: Diagnosis not present

## 2019-07-05 DIAGNOSIS — Z0000 Encounter for general adult medical examination without abnormal findings: Secondary | ICD-10-CM | POA: Diagnosis not present

## 2019-07-05 DIAGNOSIS — M810 Age-related osteoporosis without current pathological fracture: Secondary | ICD-10-CM | POA: Diagnosis not present

## 2019-07-05 DIAGNOSIS — E663 Overweight: Secondary | ICD-10-CM | POA: Diagnosis not present

## 2019-07-05 DIAGNOSIS — D692 Other nonthrombocytopenic purpura: Secondary | ICD-10-CM | POA: Diagnosis not present

## 2019-07-12 DIAGNOSIS — R69 Illness, unspecified: Secondary | ICD-10-CM | POA: Diagnosis not present

## 2019-08-07 DIAGNOSIS — Z20828 Contact with and (suspected) exposure to other viral communicable diseases: Secondary | ICD-10-CM | POA: Diagnosis not present

## 2019-10-17 DIAGNOSIS — M810 Age-related osteoporosis without current pathological fracture: Secondary | ICD-10-CM | POA: Diagnosis not present

## 2019-10-17 DIAGNOSIS — F3341 Major depressive disorder, recurrent, in partial remission: Secondary | ICD-10-CM | POA: Diagnosis not present

## 2019-10-17 DIAGNOSIS — R69 Illness, unspecified: Secondary | ICD-10-CM | POA: Diagnosis not present

## 2019-10-31 DIAGNOSIS — Z1231 Encounter for screening mammogram for malignant neoplasm of breast: Secondary | ICD-10-CM | POA: Diagnosis not present

## 2019-12-17 DIAGNOSIS — J301 Allergic rhinitis due to pollen: Secondary | ICD-10-CM | POA: Diagnosis not present

## 2019-12-17 DIAGNOSIS — H66003 Acute suppurative otitis media without spontaneous rupture of ear drum, bilateral: Secondary | ICD-10-CM | POA: Diagnosis not present

## 2019-12-24 DIAGNOSIS — R197 Diarrhea, unspecified: Secondary | ICD-10-CM | POA: Diagnosis not present

## 2019-12-28 DIAGNOSIS — R197 Diarrhea, unspecified: Secondary | ICD-10-CM | POA: Diagnosis not present

## 2020-01-01 DIAGNOSIS — R197 Diarrhea, unspecified: Secondary | ICD-10-CM | POA: Diagnosis not present

## 2020-01-01 DIAGNOSIS — R69 Illness, unspecified: Secondary | ICD-10-CM | POA: Diagnosis not present

## 2020-02-08 ENCOUNTER — Ambulatory Visit
Admission: RE | Admit: 2020-02-08 | Discharge: 2020-02-08 | Disposition: A | Discharge status: Home / Self Care | Payer: Medicare HMO | Source: Ambulatory Visit | Attending: Internal Medicine | Admitting: Internal Medicine

## 2020-02-08 ENCOUNTER — Other Ambulatory Visit: Payer: Self-pay | Admitting: Internal Medicine

## 2020-02-08 DIAGNOSIS — R350 Frequency of micturition: Secondary | ICD-10-CM | POA: Diagnosis not present

## 2020-02-08 DIAGNOSIS — N3941 Urge incontinence: Secondary | ICD-10-CM | POA: Diagnosis not present

## 2020-02-08 DIAGNOSIS — M79674 Pain in right toe(s): Secondary | ICD-10-CM

## 2020-03-18 DIAGNOSIS — R198 Other specified symptoms and signs involving the digestive system and abdomen: Secondary | ICD-10-CM | POA: Diagnosis not present

## 2020-03-18 DIAGNOSIS — N3281 Overactive bladder: Secondary | ICD-10-CM | POA: Diagnosis not present

## 2020-03-18 DIAGNOSIS — N390 Urinary tract infection, site not specified: Secondary | ICD-10-CM | POA: Diagnosis not present

## 2020-03-18 DIAGNOSIS — N952 Postmenopausal atrophic vaginitis: Secondary | ICD-10-CM | POA: Diagnosis not present

## 2020-04-14 DIAGNOSIS — R69 Illness, unspecified: Secondary | ICD-10-CM | POA: Diagnosis not present

## 2020-04-14 DIAGNOSIS — M810 Age-related osteoporosis without current pathological fracture: Secondary | ICD-10-CM | POA: Diagnosis not present

## 2020-04-14 DIAGNOSIS — F3341 Major depressive disorder, recurrent, in partial remission: Secondary | ICD-10-CM | POA: Diagnosis not present

## 2020-04-21 DIAGNOSIS — Z20822 Contact with and (suspected) exposure to covid-19: Secondary | ICD-10-CM | POA: Diagnosis not present

## 2020-04-23 DIAGNOSIS — R69 Illness, unspecified: Secondary | ICD-10-CM | POA: Diagnosis not present

## 2020-05-06 DIAGNOSIS — Z20822 Contact with and (suspected) exposure to covid-19: Secondary | ICD-10-CM | POA: Diagnosis not present

## 2020-06-10 DIAGNOSIS — R69 Illness, unspecified: Secondary | ICD-10-CM | POA: Diagnosis not present

## 2020-07-19 DIAGNOSIS — R69 Illness, unspecified: Secondary | ICD-10-CM | POA: Diagnosis not present

## 2020-07-19 DIAGNOSIS — R410 Disorientation, unspecified: Secondary | ICD-10-CM | POA: Diagnosis not present

## 2020-07-19 DIAGNOSIS — Z79899 Other long term (current) drug therapy: Secondary | ICD-10-CM | POA: Diagnosis not present

## 2020-07-19 DIAGNOSIS — Z882 Allergy status to sulfonamides status: Secondary | ICD-10-CM | POA: Diagnosis not present

## 2020-07-19 DIAGNOSIS — R918 Other nonspecific abnormal finding of lung field: Secondary | ICD-10-CM | POA: Diagnosis not present

## 2020-07-19 DIAGNOSIS — R519 Headache, unspecified: Secondary | ICD-10-CM | POA: Diagnosis not present

## 2020-07-19 DIAGNOSIS — R4182 Altered mental status, unspecified: Secondary | ICD-10-CM | POA: Diagnosis not present

## 2020-07-19 DIAGNOSIS — I499 Cardiac arrhythmia, unspecified: Secondary | ICD-10-CM | POA: Diagnosis not present

## 2020-07-19 DIAGNOSIS — Z888 Allergy status to other drugs, medicaments and biological substances status: Secondary | ICD-10-CM | POA: Diagnosis not present

## 2020-07-19 DIAGNOSIS — Z881 Allergy status to other antibiotic agents status: Secondary | ICD-10-CM | POA: Diagnosis not present

## 2020-07-23 DIAGNOSIS — G454 Transient global amnesia: Secondary | ICD-10-CM | POA: Diagnosis not present

## 2020-07-23 DIAGNOSIS — R69 Illness, unspecified: Secondary | ICD-10-CM | POA: Diagnosis not present

## 2020-07-24 ENCOUNTER — Encounter: Payer: Self-pay | Admitting: Neurology

## 2020-07-24 DIAGNOSIS — L820 Inflamed seborrheic keratosis: Secondary | ICD-10-CM | POA: Diagnosis not present

## 2020-07-24 DIAGNOSIS — D485 Neoplasm of uncertain behavior of skin: Secondary | ICD-10-CM | POA: Diagnosis not present

## 2020-07-24 DIAGNOSIS — L570 Actinic keratosis: Secondary | ICD-10-CM | POA: Diagnosis not present

## 2020-08-04 DIAGNOSIS — F4321 Adjustment disorder with depressed mood: Secondary | ICD-10-CM | POA: Diagnosis not present

## 2020-08-04 DIAGNOSIS — G43109 Migraine with aura, not intractable, without status migrainosus: Secondary | ICD-10-CM | POA: Diagnosis not present

## 2020-08-04 DIAGNOSIS — G454 Transient global amnesia: Secondary | ICD-10-CM | POA: Diagnosis not present

## 2020-08-04 DIAGNOSIS — F419 Anxiety disorder, unspecified: Secondary | ICD-10-CM | POA: Diagnosis not present

## 2020-08-04 DIAGNOSIS — R69 Illness, unspecified: Secondary | ICD-10-CM | POA: Diagnosis not present

## 2020-08-04 DIAGNOSIS — F329 Major depressive disorder, single episode, unspecified: Secondary | ICD-10-CM | POA: Diagnosis not present

## 2020-08-06 DIAGNOSIS — M810 Age-related osteoporosis without current pathological fracture: Secondary | ICD-10-CM | POA: Diagnosis not present

## 2020-08-06 DIAGNOSIS — G454 Transient global amnesia: Secondary | ICD-10-CM | POA: Diagnosis not present

## 2020-08-06 DIAGNOSIS — G4700 Insomnia, unspecified: Secondary | ICD-10-CM | POA: Diagnosis not present

## 2020-08-06 DIAGNOSIS — R69 Illness, unspecified: Secondary | ICD-10-CM | POA: Diagnosis not present

## 2020-08-06 DIAGNOSIS — Z0000 Encounter for general adult medical examination without abnormal findings: Secondary | ICD-10-CM | POA: Diagnosis not present

## 2020-08-12 ENCOUNTER — Other Ambulatory Visit: Payer: Self-pay | Admitting: Internal Medicine

## 2020-08-12 DIAGNOSIS — M810 Age-related osteoporosis without current pathological fracture: Secondary | ICD-10-CM

## 2020-09-03 DIAGNOSIS — G43009 Migraine without aura, not intractable, without status migrainosus: Secondary | ICD-10-CM | POA: Diagnosis not present

## 2020-09-03 DIAGNOSIS — G454 Transient global amnesia: Secondary | ICD-10-CM | POA: Diagnosis not present

## 2020-09-04 DIAGNOSIS — G454 Transient global amnesia: Secondary | ICD-10-CM | POA: Diagnosis not present

## 2020-09-16 DIAGNOSIS — M25511 Pain in right shoulder: Secondary | ICD-10-CM | POA: Diagnosis not present

## 2020-09-16 DIAGNOSIS — M545 Low back pain, unspecified: Secondary | ICD-10-CM | POA: Diagnosis not present

## 2020-09-18 DIAGNOSIS — N3281 Overactive bladder: Secondary | ICD-10-CM | POA: Diagnosis not present

## 2020-09-18 DIAGNOSIS — R198 Other specified symptoms and signs involving the digestive system and abdomen: Secondary | ICD-10-CM | POA: Diagnosis not present

## 2020-09-18 DIAGNOSIS — N905 Atrophy of vulva: Secondary | ICD-10-CM | POA: Diagnosis not present

## 2020-09-18 DIAGNOSIS — R69 Illness, unspecified: Secondary | ICD-10-CM | POA: Diagnosis not present

## 2020-09-26 DIAGNOSIS — M25511 Pain in right shoulder: Secondary | ICD-10-CM | POA: Diagnosis not present

## 2020-09-26 DIAGNOSIS — M545 Low back pain, unspecified: Secondary | ICD-10-CM | POA: Diagnosis not present

## 2020-09-29 DIAGNOSIS — F4321 Adjustment disorder with depressed mood: Secondary | ICD-10-CM | POA: Diagnosis not present

## 2020-09-29 DIAGNOSIS — R69 Illness, unspecified: Secondary | ICD-10-CM | POA: Diagnosis not present

## 2020-09-29 DIAGNOSIS — F410 Panic disorder [episodic paroxysmal anxiety] without agoraphobia: Secondary | ICD-10-CM | POA: Diagnosis not present

## 2020-09-29 DIAGNOSIS — F411 Generalized anxiety disorder: Secondary | ICD-10-CM | POA: Diagnosis not present

## 2020-10-01 DIAGNOSIS — M25511 Pain in right shoulder: Secondary | ICD-10-CM | POA: Diagnosis not present

## 2020-10-08 DIAGNOSIS — R6889 Other general symptoms and signs: Secondary | ICD-10-CM | POA: Diagnosis not present

## 2020-10-08 DIAGNOSIS — M25511 Pain in right shoulder: Secondary | ICD-10-CM | POA: Diagnosis not present

## 2020-10-13 DIAGNOSIS — M542 Cervicalgia: Secondary | ICD-10-CM | POA: Diagnosis not present

## 2020-10-13 DIAGNOSIS — M67911 Unspecified disorder of synovium and tendon, right shoulder: Secondary | ICD-10-CM | POA: Diagnosis not present

## 2020-10-13 DIAGNOSIS — M5442 Lumbago with sciatica, left side: Secondary | ICD-10-CM | POA: Diagnosis not present

## 2020-10-15 DIAGNOSIS — M25511 Pain in right shoulder: Secondary | ICD-10-CM | POA: Diagnosis not present

## 2020-10-15 DIAGNOSIS — R6889 Other general symptoms and signs: Secondary | ICD-10-CM | POA: Diagnosis not present

## 2020-10-16 ENCOUNTER — Ambulatory Visit: Payer: Medicare HMO | Admitting: Neurology

## 2020-10-21 DIAGNOSIS — D1809 Hemangioma of other sites: Secondary | ICD-10-CM | POA: Diagnosis not present

## 2020-10-21 DIAGNOSIS — M542 Cervicalgia: Secondary | ICD-10-CM | POA: Diagnosis not present

## 2020-10-21 DIAGNOSIS — M2578 Osteophyte, vertebrae: Secondary | ICD-10-CM | POA: Diagnosis not present

## 2020-10-21 DIAGNOSIS — M47816 Spondylosis without myelopathy or radiculopathy, lumbar region: Secondary | ICD-10-CM | POA: Diagnosis not present

## 2020-10-21 DIAGNOSIS — M50121 Cervical disc disorder at C4-C5 level with radiculopathy: Secondary | ICD-10-CM | POA: Diagnosis not present

## 2020-10-21 DIAGNOSIS — M5442 Lumbago with sciatica, left side: Secondary | ICD-10-CM | POA: Diagnosis not present

## 2020-10-21 DIAGNOSIS — M4722 Other spondylosis with radiculopathy, cervical region: Secondary | ICD-10-CM | POA: Diagnosis not present

## 2020-10-21 DIAGNOSIS — M4802 Spinal stenosis, cervical region: Secondary | ICD-10-CM | POA: Diagnosis not present

## 2020-10-21 DIAGNOSIS — M5116 Intervertebral disc disorders with radiculopathy, lumbar region: Secondary | ICD-10-CM | POA: Diagnosis not present

## 2020-10-29 DIAGNOSIS — R69 Illness, unspecified: Secondary | ICD-10-CM | POA: Diagnosis not present

## 2020-10-29 DIAGNOSIS — M810 Age-related osteoporosis without current pathological fracture: Secondary | ICD-10-CM | POA: Diagnosis not present

## 2020-10-29 DIAGNOSIS — G4700 Insomnia, unspecified: Secondary | ICD-10-CM | POA: Diagnosis not present

## 2020-11-04 DIAGNOSIS — F410 Panic disorder [episodic paroxysmal anxiety] without agoraphobia: Secondary | ICD-10-CM | POA: Diagnosis not present

## 2020-11-04 DIAGNOSIS — F411 Generalized anxiety disorder: Secondary | ICD-10-CM | POA: Diagnosis not present

## 2020-11-04 DIAGNOSIS — F4321 Adjustment disorder with depressed mood: Secondary | ICD-10-CM | POA: Diagnosis not present

## 2020-11-04 DIAGNOSIS — R69 Illness, unspecified: Secondary | ICD-10-CM | POA: Diagnosis not present

## 2020-11-05 DIAGNOSIS — Z1231 Encounter for screening mammogram for malignant neoplasm of breast: Secondary | ICD-10-CM | POA: Diagnosis not present

## 2020-11-05 DIAGNOSIS — M25511 Pain in right shoulder: Secondary | ICD-10-CM | POA: Diagnosis not present

## 2020-11-06 DIAGNOSIS — G43109 Migraine with aura, not intractable, without status migrainosus: Secondary | ICD-10-CM | POA: Diagnosis not present

## 2020-11-10 DIAGNOSIS — F410 Panic disorder [episodic paroxysmal anxiety] without agoraphobia: Secondary | ICD-10-CM | POA: Diagnosis not present

## 2020-11-10 DIAGNOSIS — F411 Generalized anxiety disorder: Secondary | ICD-10-CM | POA: Diagnosis not present

## 2020-11-10 DIAGNOSIS — R69 Illness, unspecified: Secondary | ICD-10-CM | POA: Diagnosis not present

## 2020-11-10 DIAGNOSIS — F4321 Adjustment disorder with depressed mood: Secondary | ICD-10-CM | POA: Diagnosis not present

## 2020-11-12 DIAGNOSIS — M25511 Pain in right shoulder: Secondary | ICD-10-CM | POA: Diagnosis not present

## 2020-11-12 DIAGNOSIS — M25552 Pain in left hip: Secondary | ICD-10-CM | POA: Diagnosis not present

## 2020-11-14 DIAGNOSIS — R922 Inconclusive mammogram: Secondary | ICD-10-CM | POA: Diagnosis not present

## 2020-11-14 DIAGNOSIS — R920 Mammographic microcalcification found on diagnostic imaging of breast: Secondary | ICD-10-CM | POA: Diagnosis not present

## 2020-11-17 DIAGNOSIS — R920 Mammographic microcalcification found on diagnostic imaging of breast: Secondary | ICD-10-CM | POA: Diagnosis not present

## 2020-11-17 DIAGNOSIS — D0511 Intraductal carcinoma in situ of right breast: Secondary | ICD-10-CM | POA: Diagnosis not present

## 2020-11-20 DIAGNOSIS — F4321 Adjustment disorder with depressed mood: Secondary | ICD-10-CM | POA: Diagnosis not present

## 2020-11-20 DIAGNOSIS — F411 Generalized anxiety disorder: Secondary | ICD-10-CM | POA: Diagnosis not present

## 2020-11-20 DIAGNOSIS — F410 Panic disorder [episodic paroxysmal anxiety] without agoraphobia: Secondary | ICD-10-CM | POA: Diagnosis not present

## 2020-11-20 DIAGNOSIS — R69 Illness, unspecified: Secondary | ICD-10-CM | POA: Diagnosis not present

## 2020-11-21 DIAGNOSIS — F4321 Adjustment disorder with depressed mood: Secondary | ICD-10-CM | POA: Diagnosis not present

## 2020-11-21 DIAGNOSIS — R69 Illness, unspecified: Secondary | ICD-10-CM | POA: Diagnosis not present

## 2020-11-21 DIAGNOSIS — C50411 Malignant neoplasm of upper-outer quadrant of right female breast: Secondary | ICD-10-CM | POA: Diagnosis not present

## 2020-11-24 DIAGNOSIS — C50411 Malignant neoplasm of upper-outer quadrant of right female breast: Secondary | ICD-10-CM | POA: Diagnosis not present

## 2020-11-24 DIAGNOSIS — C50919 Malignant neoplasm of unspecified site of unspecified female breast: Secondary | ICD-10-CM | POA: Diagnosis not present

## 2020-11-24 DIAGNOSIS — Z7183 Encounter for nonprocreative genetic counseling: Secondary | ICD-10-CM | POA: Diagnosis not present

## 2020-11-24 DIAGNOSIS — Z800 Family history of malignant neoplasm of digestive organs: Secondary | ICD-10-CM | POA: Diagnosis not present

## 2020-11-24 DIAGNOSIS — Z8042 Family history of malignant neoplasm of prostate: Secondary | ICD-10-CM | POA: Diagnosis not present

## 2020-11-24 DIAGNOSIS — Z803 Family history of malignant neoplasm of breast: Secondary | ICD-10-CM | POA: Diagnosis not present

## 2020-11-26 DIAGNOSIS — S01111A Laceration without foreign body of right eyelid and periocular area, initial encounter: Secondary | ICD-10-CM | POA: Diagnosis not present

## 2020-11-26 DIAGNOSIS — S8001XA Contusion of right knee, initial encounter: Secondary | ICD-10-CM | POA: Diagnosis not present

## 2020-11-26 DIAGNOSIS — Y9301 Activity, walking, marching and hiking: Secondary | ICD-10-CM | POA: Diagnosis not present

## 2020-11-26 DIAGNOSIS — S066X0A Traumatic subarachnoid hemorrhage without loss of consciousness, initial encounter: Secondary | ICD-10-CM | POA: Diagnosis not present

## 2020-11-26 DIAGNOSIS — I609 Nontraumatic subarachnoid hemorrhage, unspecified: Secondary | ICD-10-CM | POA: Diagnosis not present

## 2020-11-26 DIAGNOSIS — Z853 Personal history of malignant neoplasm of breast: Secondary | ICD-10-CM | POA: Diagnosis not present

## 2020-11-26 DIAGNOSIS — W010XXA Fall on same level from slipping, tripping and stumbling without subsequent striking against object, initial encounter: Secondary | ICD-10-CM | POA: Diagnosis not present

## 2020-11-26 DIAGNOSIS — S0990XA Unspecified injury of head, initial encounter: Secondary | ICD-10-CM | POA: Diagnosis not present

## 2020-11-26 DIAGNOSIS — W1839XA Other fall on same level, initial encounter: Secondary | ICD-10-CM | POA: Diagnosis not present

## 2020-11-26 DIAGNOSIS — Y92480 Sidewalk as the place of occurrence of the external cause: Secondary | ICD-10-CM | POA: Diagnosis not present

## 2020-11-26 DIAGNOSIS — M810 Age-related osteoporosis without current pathological fracture: Secondary | ICD-10-CM | POA: Diagnosis not present

## 2020-11-26 DIAGNOSIS — Z881 Allergy status to other antibiotic agents status: Secondary | ICD-10-CM | POA: Diagnosis not present

## 2020-11-26 DIAGNOSIS — M25561 Pain in right knee: Secondary | ICD-10-CM | POA: Diagnosis not present

## 2020-11-26 DIAGNOSIS — Z043 Encounter for examination and observation following other accident: Secondary | ICD-10-CM | POA: Diagnosis not present

## 2020-11-27 DIAGNOSIS — Y92480 Sidewalk as the place of occurrence of the external cause: Secondary | ICD-10-CM | POA: Diagnosis not present

## 2020-11-27 DIAGNOSIS — S066X0A Traumatic subarachnoid hemorrhage without loss of consciousness, initial encounter: Secondary | ICD-10-CM | POA: Diagnosis not present

## 2020-11-27 DIAGNOSIS — W010XXA Fall on same level from slipping, tripping and stumbling without subsequent striking against object, initial encounter: Secondary | ICD-10-CM | POA: Diagnosis not present

## 2020-11-27 DIAGNOSIS — Y9301 Activity, walking, marching and hiking: Secondary | ICD-10-CM | POA: Diagnosis not present

## 2020-11-27 DIAGNOSIS — S01111A Laceration without foreign body of right eyelid and periocular area, initial encounter: Secondary | ICD-10-CM | POA: Diagnosis not present

## 2020-12-04 ENCOUNTER — Other Ambulatory Visit: Payer: Medicare HMO

## 2020-12-15 DIAGNOSIS — C50411 Malignant neoplasm of upper-outer quadrant of right female breast: Secondary | ICD-10-CM | POA: Diagnosis not present

## 2020-12-15 DIAGNOSIS — I609 Nontraumatic subarachnoid hemorrhage, unspecified: Secondary | ICD-10-CM | POA: Diagnosis not present

## 2020-12-15 DIAGNOSIS — Z01812 Encounter for preprocedural laboratory examination: Secondary | ICD-10-CM | POA: Diagnosis not present

## 2020-12-15 DIAGNOSIS — Z22322 Carrier or suspected carrier of Methicillin resistant Staphylococcus aureus: Secondary | ICD-10-CM | POA: Diagnosis not present

## 2020-12-15 DIAGNOSIS — E669 Obesity, unspecified: Secondary | ICD-10-CM | POA: Diagnosis not present

## 2020-12-15 DIAGNOSIS — R69 Illness, unspecified: Secondary | ICD-10-CM | POA: Diagnosis not present

## 2020-12-15 DIAGNOSIS — Z01818 Encounter for other preprocedural examination: Secondary | ICD-10-CM | POA: Diagnosis not present

## 2020-12-18 DIAGNOSIS — F4321 Adjustment disorder with depressed mood: Secondary | ICD-10-CM | POA: Diagnosis not present

## 2020-12-18 DIAGNOSIS — F331 Major depressive disorder, recurrent, moderate: Secondary | ICD-10-CM | POA: Diagnosis not present

## 2020-12-18 DIAGNOSIS — F410 Panic disorder [episodic paroxysmal anxiety] without agoraphobia: Secondary | ICD-10-CM | POA: Diagnosis not present

## 2020-12-18 DIAGNOSIS — R69 Illness, unspecified: Secondary | ICD-10-CM | POA: Diagnosis not present

## 2020-12-20 IMAGING — CR DG TOE 2ND 2+V*R*
3 series · 3 of 3 positions shown · non-contrast
Comparison: None.

CLINICAL DATA: Pain of the second toe.

EXAM:
RIGHT SECOND TOE

[t toes ap right]
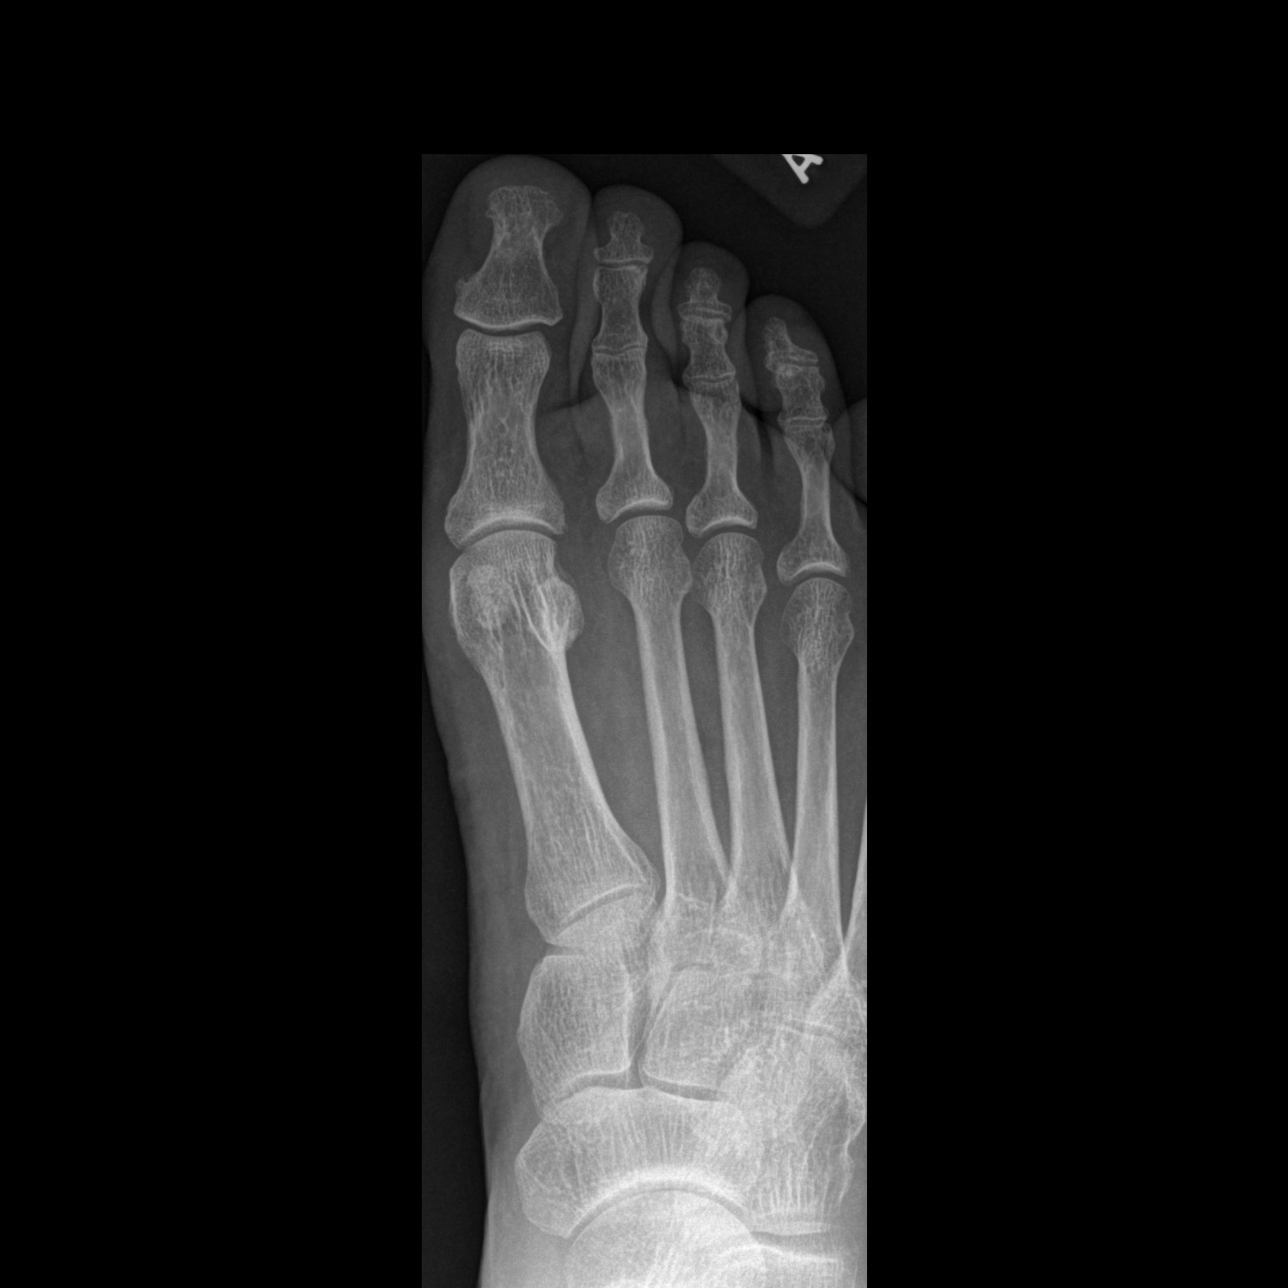

[t toes oblique right]
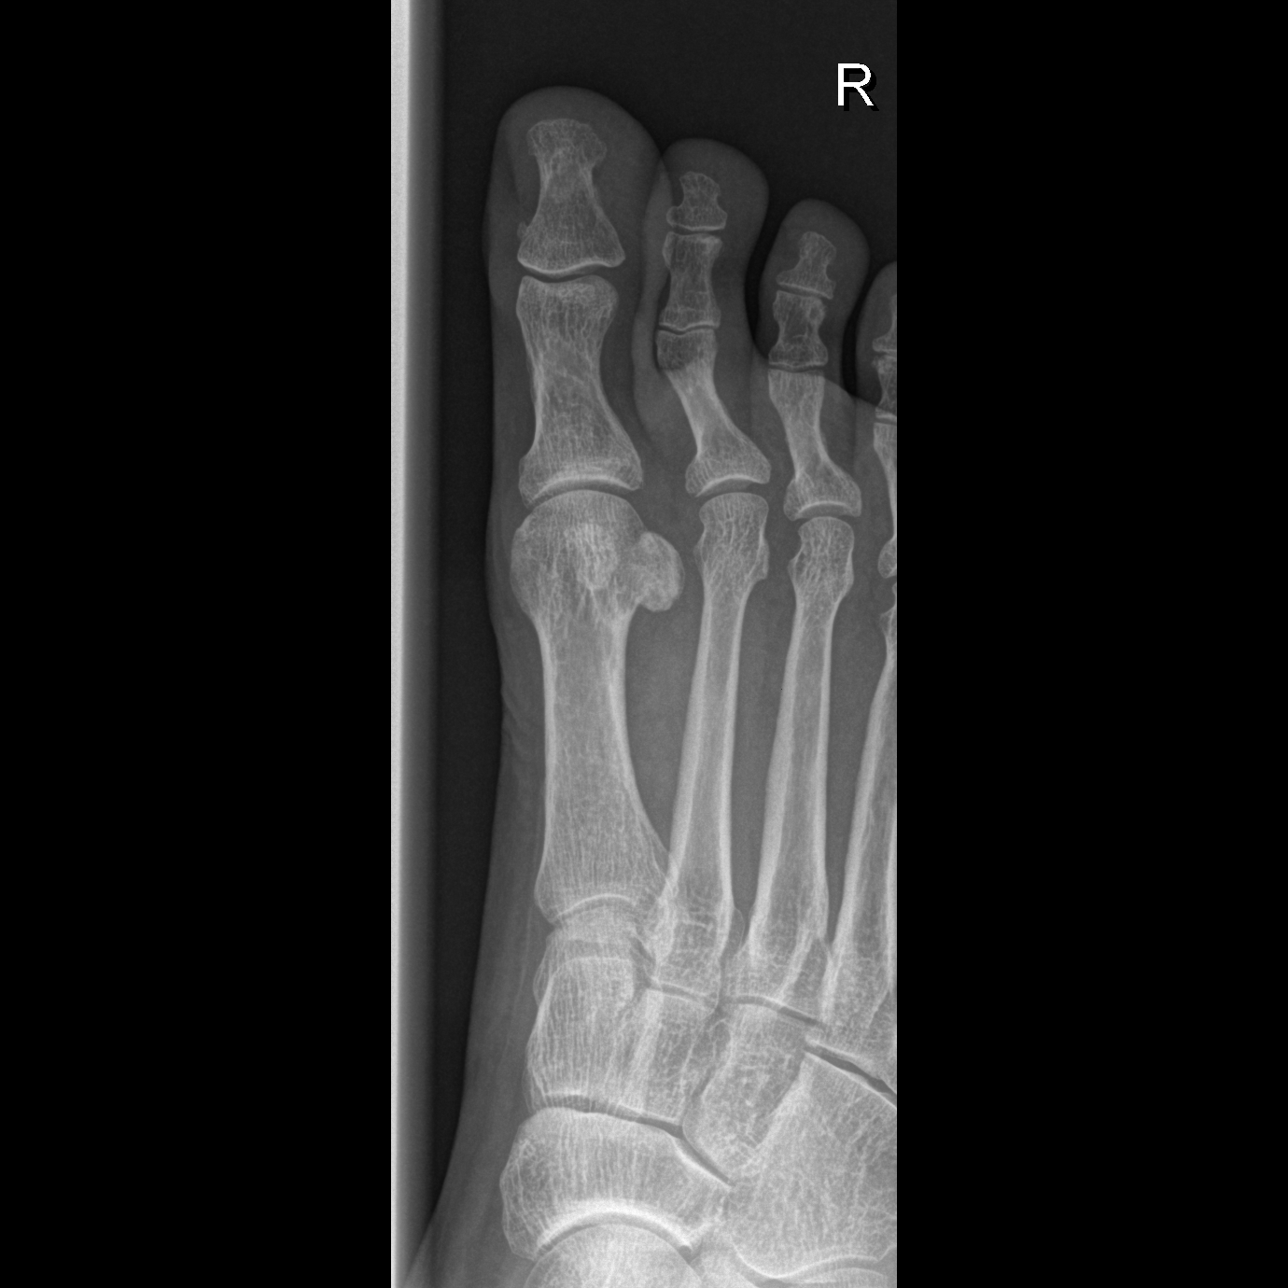

[t toes lateral right]
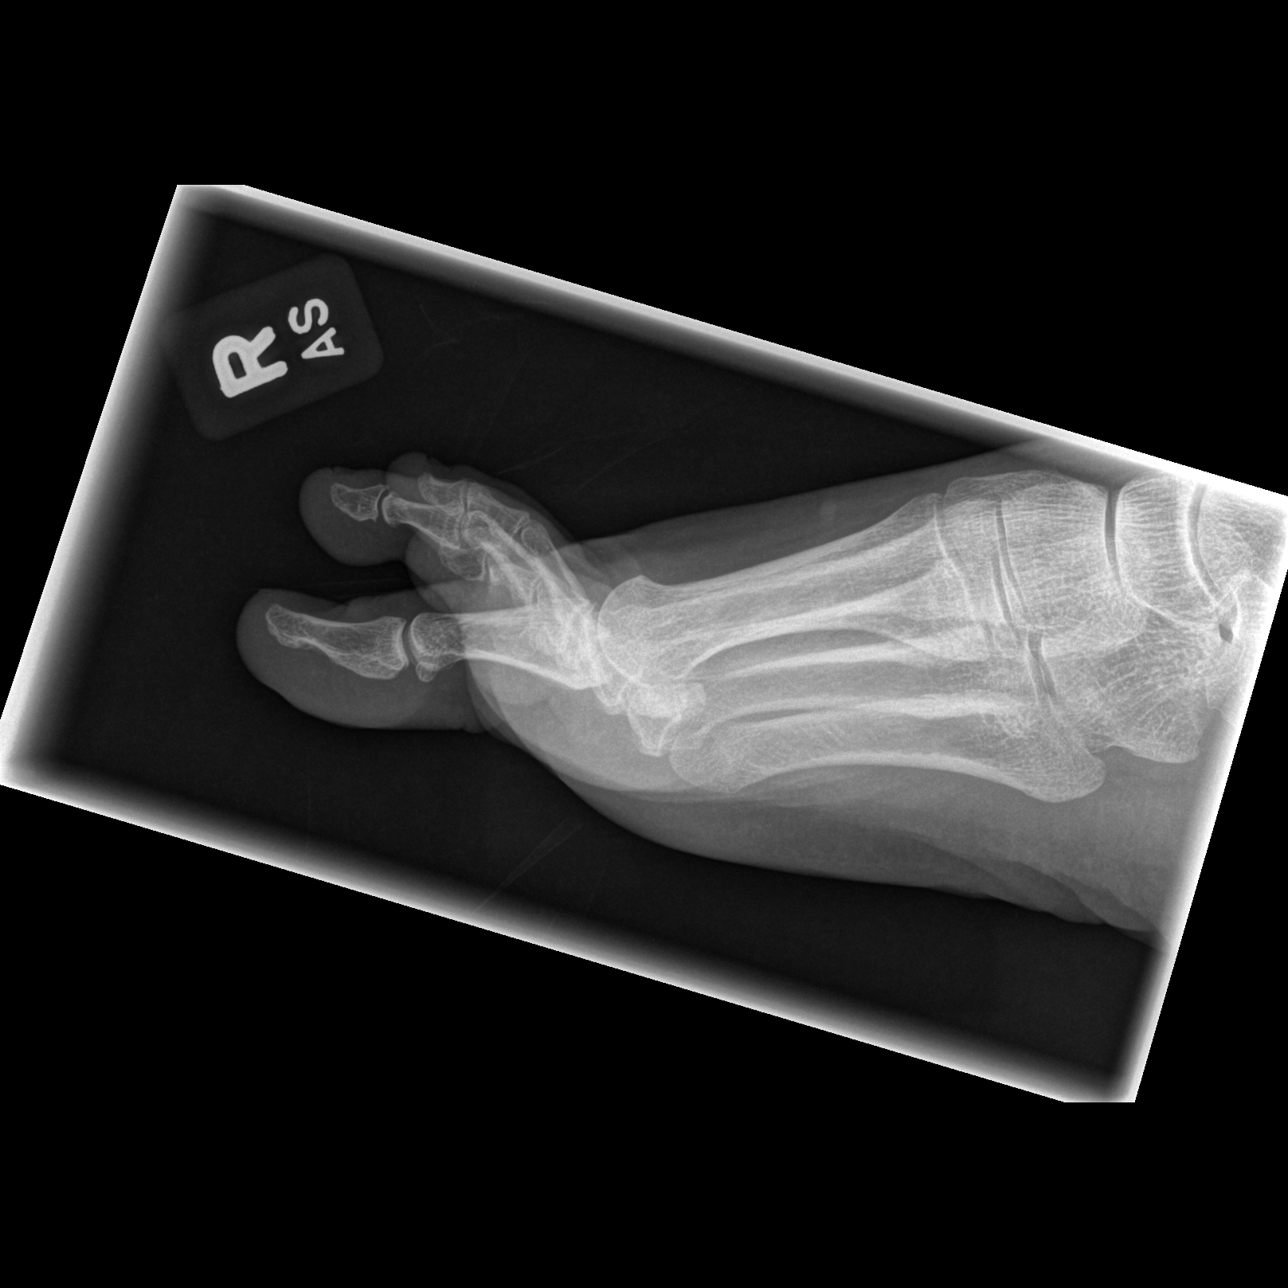

[3 of 3 positions shown; findings below may reference images not displayed]

FINDINGS: There is no evidence of fracture or dislocation. There is no
evidence of arthropathy or other focal bone abnormality. Soft
tissues are unremarkable.
IMPRESSION: Negative.

## 2020-12-24 DIAGNOSIS — F410 Panic disorder [episodic paroxysmal anxiety] without agoraphobia: Secondary | ICD-10-CM | POA: Diagnosis not present

## 2020-12-24 DIAGNOSIS — F411 Generalized anxiety disorder: Secondary | ICD-10-CM | POA: Diagnosis not present

## 2020-12-24 DIAGNOSIS — R69 Illness, unspecified: Secondary | ICD-10-CM | POA: Diagnosis not present

## 2020-12-24 DIAGNOSIS — Z658 Other specified problems related to psychosocial circumstances: Secondary | ICD-10-CM | POA: Diagnosis not present

## 2020-12-25 DIAGNOSIS — C50411 Malignant neoplasm of upper-outer quadrant of right female breast: Secondary | ICD-10-CM | POA: Diagnosis not present

## 2020-12-25 DIAGNOSIS — Z6830 Body mass index (BMI) 30.0-30.9, adult: Secondary | ICD-10-CM | POA: Diagnosis not present

## 2020-12-25 DIAGNOSIS — Z90710 Acquired absence of both cervix and uterus: Secondary | ICD-10-CM | POA: Diagnosis not present

## 2020-12-25 DIAGNOSIS — Z888 Allergy status to other drugs, medicaments and biological substances status: Secondary | ICD-10-CM | POA: Diagnosis not present

## 2020-12-25 DIAGNOSIS — G43909 Migraine, unspecified, not intractable, without status migrainosus: Secondary | ICD-10-CM | POA: Diagnosis not present

## 2020-12-25 DIAGNOSIS — Z881 Allergy status to other antibiotic agents status: Secondary | ICD-10-CM | POA: Diagnosis not present

## 2020-12-25 DIAGNOSIS — D0511 Intraductal carcinoma in situ of right breast: Secondary | ICD-10-CM | POA: Diagnosis not present

## 2020-12-25 DIAGNOSIS — Z803 Family history of malignant neoplasm of breast: Secondary | ICD-10-CM | POA: Diagnosis not present

## 2020-12-25 DIAGNOSIS — Z882 Allergy status to sulfonamides status: Secondary | ICD-10-CM | POA: Diagnosis not present

## 2020-12-25 DIAGNOSIS — R69 Illness, unspecified: Secondary | ICD-10-CM | POA: Diagnosis not present

## 2020-12-25 DIAGNOSIS — Z886 Allergy status to analgesic agent status: Secondary | ICD-10-CM | POA: Diagnosis not present

## 2020-12-25 DIAGNOSIS — E669 Obesity, unspecified: Secondary | ICD-10-CM | POA: Diagnosis not present

## 2021-01-02 DIAGNOSIS — C50411 Malignant neoplasm of upper-outer quadrant of right female breast: Secondary | ICD-10-CM | POA: Diagnosis not present

## 2021-01-02 DIAGNOSIS — M810 Age-related osteoporosis without current pathological fracture: Secondary | ICD-10-CM | POA: Diagnosis not present

## 2021-02-12 DIAGNOSIS — G43109 Migraine with aura, not intractable, without status migrainosus: Secondary | ICD-10-CM | POA: Diagnosis not present

## 2021-02-27 DIAGNOSIS — D0511 Intraductal carcinoma in situ of right breast: Secondary | ICD-10-CM | POA: Diagnosis not present

## 2021-02-27 DIAGNOSIS — Z170 Estrogen receptor positive status [ER+]: Secondary | ICD-10-CM | POA: Diagnosis not present

## 2021-03-03 DIAGNOSIS — D0511 Intraductal carcinoma in situ of right breast: Secondary | ICD-10-CM | POA: Diagnosis not present

## 2021-03-06 DIAGNOSIS — R922 Inconclusive mammogram: Secondary | ICD-10-CM | POA: Diagnosis not present

## 2021-03-06 DIAGNOSIS — D0511 Intraductal carcinoma in situ of right breast: Secondary | ICD-10-CM | POA: Diagnosis not present

## 2021-03-06 DIAGNOSIS — Z9011 Acquired absence of right breast and nipple: Secondary | ICD-10-CM | POA: Diagnosis not present

## 2021-03-06 DIAGNOSIS — N6310 Unspecified lump in the right breast, unspecified quadrant: Secondary | ICD-10-CM | POA: Diagnosis not present

## 2021-03-06 DIAGNOSIS — R920 Mammographic microcalcification found on diagnostic imaging of breast: Secondary | ICD-10-CM | POA: Diagnosis not present

## 2021-03-18 DIAGNOSIS — D0511 Intraductal carcinoma in situ of right breast: Secondary | ICD-10-CM | POA: Diagnosis not present

## 2021-03-18 DIAGNOSIS — Z170 Estrogen receptor positive status [ER+]: Secondary | ICD-10-CM | POA: Diagnosis not present

## 2021-03-26 DIAGNOSIS — Z170 Estrogen receptor positive status [ER+]: Secondary | ICD-10-CM | POA: Diagnosis not present

## 2021-03-26 DIAGNOSIS — D0511 Intraductal carcinoma in situ of right breast: Secondary | ICD-10-CM | POA: Diagnosis not present

## 2021-03-26 DIAGNOSIS — Z510 Encounter for antineoplastic radiation therapy: Secondary | ICD-10-CM | POA: Diagnosis not present

## 2021-03-30 DIAGNOSIS — M2550 Pain in unspecified joint: Secondary | ICD-10-CM | POA: Diagnosis not present

## 2021-03-30 DIAGNOSIS — R5381 Other malaise: Secondary | ICD-10-CM | POA: Diagnosis not present

## 2021-03-30 DIAGNOSIS — R5383 Other fatigue: Secondary | ICD-10-CM | POA: Diagnosis not present

## 2021-03-30 DIAGNOSIS — D0511 Intraductal carcinoma in situ of right breast: Secondary | ICD-10-CM | POA: Diagnosis not present

## 2021-04-02 DIAGNOSIS — Z510 Encounter for antineoplastic radiation therapy: Secondary | ICD-10-CM | POA: Diagnosis not present

## 2021-04-02 DIAGNOSIS — D0511 Intraductal carcinoma in situ of right breast: Secondary | ICD-10-CM | POA: Diagnosis not present

## 2021-04-02 DIAGNOSIS — Z170 Estrogen receptor positive status [ER+]: Secondary | ICD-10-CM | POA: Diagnosis not present

## 2021-04-13 DIAGNOSIS — Z170 Estrogen receptor positive status [ER+]: Secondary | ICD-10-CM | POA: Diagnosis not present

## 2021-04-13 DIAGNOSIS — D0511 Intraductal carcinoma in situ of right breast: Secondary | ICD-10-CM | POA: Diagnosis not present

## 2021-04-13 DIAGNOSIS — Z510 Encounter for antineoplastic radiation therapy: Secondary | ICD-10-CM | POA: Diagnosis not present

## 2021-04-13 DIAGNOSIS — M489 Spondylopathy, unspecified: Secondary | ICD-10-CM | POA: Diagnosis not present

## 2021-04-13 DIAGNOSIS — M899 Disorder of bone, unspecified: Secondary | ICD-10-CM | POA: Diagnosis not present

## 2021-04-13 DIAGNOSIS — M47816 Spondylosis without myelopathy or radiculopathy, lumbar region: Secondary | ICD-10-CM | POA: Diagnosis not present

## 2021-04-13 DIAGNOSIS — M5136 Other intervertebral disc degeneration, lumbar region: Secondary | ICD-10-CM | POA: Diagnosis not present

## 2021-04-14 DIAGNOSIS — D0511 Intraductal carcinoma in situ of right breast: Secondary | ICD-10-CM | POA: Diagnosis not present

## 2021-04-14 DIAGNOSIS — Z510 Encounter for antineoplastic radiation therapy: Secondary | ICD-10-CM | POA: Diagnosis not present

## 2021-04-15 DIAGNOSIS — Z510 Encounter for antineoplastic radiation therapy: Secondary | ICD-10-CM | POA: Diagnosis not present

## 2021-04-15 DIAGNOSIS — D0511 Intraductal carcinoma in situ of right breast: Secondary | ICD-10-CM | POA: Diagnosis not present

## 2021-04-16 DIAGNOSIS — D0511 Intraductal carcinoma in situ of right breast: Secondary | ICD-10-CM | POA: Diagnosis not present

## 2021-04-16 DIAGNOSIS — Z510 Encounter for antineoplastic radiation therapy: Secondary | ICD-10-CM | POA: Diagnosis not present

## 2021-04-17 DIAGNOSIS — Z510 Encounter for antineoplastic radiation therapy: Secondary | ICD-10-CM | POA: Diagnosis not present

## 2021-04-17 DIAGNOSIS — D0511 Intraductal carcinoma in situ of right breast: Secondary | ICD-10-CM | POA: Diagnosis not present

## 2021-04-17 DIAGNOSIS — Z170 Estrogen receptor positive status [ER+]: Secondary | ICD-10-CM | POA: Diagnosis not present

## 2021-04-20 DIAGNOSIS — Z510 Encounter for antineoplastic radiation therapy: Secondary | ICD-10-CM | POA: Diagnosis not present

## 2021-04-20 DIAGNOSIS — D0511 Intraductal carcinoma in situ of right breast: Secondary | ICD-10-CM | POA: Diagnosis not present

## 2021-04-21 DIAGNOSIS — Z510 Encounter for antineoplastic radiation therapy: Secondary | ICD-10-CM | POA: Diagnosis not present

## 2021-04-21 DIAGNOSIS — D0511 Intraductal carcinoma in situ of right breast: Secondary | ICD-10-CM | POA: Diagnosis not present

## 2021-04-22 DIAGNOSIS — Z510 Encounter for antineoplastic radiation therapy: Secondary | ICD-10-CM | POA: Diagnosis not present

## 2021-04-22 DIAGNOSIS — D0511 Intraductal carcinoma in situ of right breast: Secondary | ICD-10-CM | POA: Diagnosis not present

## 2021-04-23 DIAGNOSIS — D0511 Intraductal carcinoma in situ of right breast: Secondary | ICD-10-CM | POA: Diagnosis not present

## 2021-04-23 DIAGNOSIS — Z510 Encounter for antineoplastic radiation therapy: Secondary | ICD-10-CM | POA: Diagnosis not present

## 2021-04-24 DIAGNOSIS — D0511 Intraductal carcinoma in situ of right breast: Secondary | ICD-10-CM | POA: Diagnosis not present

## 2021-04-24 DIAGNOSIS — Z510 Encounter for antineoplastic radiation therapy: Secondary | ICD-10-CM | POA: Diagnosis not present

## 2021-04-24 DIAGNOSIS — Z170 Estrogen receptor positive status [ER+]: Secondary | ICD-10-CM | POA: Diagnosis not present

## 2021-04-27 DIAGNOSIS — Z510 Encounter for antineoplastic radiation therapy: Secondary | ICD-10-CM | POA: Diagnosis not present

## 2021-04-27 DIAGNOSIS — D0511 Intraductal carcinoma in situ of right breast: Secondary | ICD-10-CM | POA: Diagnosis not present

## 2021-04-28 DIAGNOSIS — Z170 Estrogen receptor positive status [ER+]: Secondary | ICD-10-CM | POA: Diagnosis not present

## 2021-04-28 DIAGNOSIS — Z510 Encounter for antineoplastic radiation therapy: Secondary | ICD-10-CM | POA: Diagnosis not present

## 2021-04-28 DIAGNOSIS — D0511 Intraductal carcinoma in situ of right breast: Secondary | ICD-10-CM | POA: Diagnosis not present

## 2021-04-29 DIAGNOSIS — D0511 Intraductal carcinoma in situ of right breast: Secondary | ICD-10-CM | POA: Diagnosis not present

## 2021-04-29 DIAGNOSIS — Z510 Encounter for antineoplastic radiation therapy: Secondary | ICD-10-CM | POA: Diagnosis not present

## 2021-04-29 DIAGNOSIS — N281 Cyst of kidney, acquired: Secondary | ICD-10-CM | POA: Diagnosis not present

## 2021-04-29 DIAGNOSIS — K7689 Other specified diseases of liver: Secondary | ICD-10-CM | POA: Diagnosis not present

## 2021-04-30 DIAGNOSIS — Z510 Encounter for antineoplastic radiation therapy: Secondary | ICD-10-CM | POA: Diagnosis not present

## 2021-04-30 DIAGNOSIS — D0511 Intraductal carcinoma in situ of right breast: Secondary | ICD-10-CM | POA: Diagnosis not present

## 2021-05-01 DIAGNOSIS — Z510 Encounter for antineoplastic radiation therapy: Secondary | ICD-10-CM | POA: Diagnosis not present

## 2021-05-01 DIAGNOSIS — Z170 Estrogen receptor positive status [ER+]: Secondary | ICD-10-CM | POA: Diagnosis not present

## 2021-05-01 DIAGNOSIS — D0511 Intraductal carcinoma in situ of right breast: Secondary | ICD-10-CM | POA: Diagnosis not present

## 2021-05-04 DIAGNOSIS — Z510 Encounter for antineoplastic radiation therapy: Secondary | ICD-10-CM | POA: Diagnosis not present

## 2021-05-04 DIAGNOSIS — D0511 Intraductal carcinoma in situ of right breast: Secondary | ICD-10-CM | POA: Diagnosis not present

## 2021-05-05 DIAGNOSIS — D0511 Intraductal carcinoma in situ of right breast: Secondary | ICD-10-CM | POA: Diagnosis not present

## 2021-05-05 DIAGNOSIS — Z510 Encounter for antineoplastic radiation therapy: Secondary | ICD-10-CM | POA: Diagnosis not present

## 2021-05-05 DIAGNOSIS — Z170 Estrogen receptor positive status [ER+]: Secondary | ICD-10-CM | POA: Diagnosis not present

## 2021-05-06 DIAGNOSIS — D0511 Intraductal carcinoma in situ of right breast: Secondary | ICD-10-CM | POA: Diagnosis not present

## 2021-05-06 DIAGNOSIS — Z510 Encounter for antineoplastic radiation therapy: Secondary | ICD-10-CM | POA: Diagnosis not present

## 2021-05-07 DIAGNOSIS — Z510 Encounter for antineoplastic radiation therapy: Secondary | ICD-10-CM | POA: Diagnosis not present

## 2021-05-07 DIAGNOSIS — D0511 Intraductal carcinoma in situ of right breast: Secondary | ICD-10-CM | POA: Diagnosis not present

## 2021-05-08 DIAGNOSIS — D0511 Intraductal carcinoma in situ of right breast: Secondary | ICD-10-CM | POA: Diagnosis not present

## 2021-05-08 DIAGNOSIS — Z170 Estrogen receptor positive status [ER+]: Secondary | ICD-10-CM | POA: Diagnosis not present

## 2021-05-08 DIAGNOSIS — Z510 Encounter for antineoplastic radiation therapy: Secondary | ICD-10-CM | POA: Diagnosis not present

## 2021-05-27 DIAGNOSIS — F411 Generalized anxiety disorder: Secondary | ICD-10-CM | POA: Diagnosis not present

## 2021-05-27 DIAGNOSIS — R69 Illness, unspecified: Secondary | ICD-10-CM | POA: Diagnosis not present

## 2021-06-03 DIAGNOSIS — R69 Illness, unspecified: Secondary | ICD-10-CM | POA: Diagnosis not present

## 2021-06-03 DIAGNOSIS — F4323 Adjustment disorder with mixed anxiety and depressed mood: Secondary | ICD-10-CM | POA: Diagnosis not present

## 2021-06-18 DIAGNOSIS — F410 Panic disorder [episodic paroxysmal anxiety] without agoraphobia: Secondary | ICD-10-CM | POA: Diagnosis not present

## 2021-06-18 DIAGNOSIS — F331 Major depressive disorder, recurrent, moderate: Secondary | ICD-10-CM | POA: Diagnosis not present

## 2021-06-18 DIAGNOSIS — R69 Illness, unspecified: Secondary | ICD-10-CM | POA: Diagnosis not present

## 2021-06-18 DIAGNOSIS — F4321 Adjustment disorder with depressed mood: Secondary | ICD-10-CM | POA: Diagnosis not present

## 2021-06-18 DIAGNOSIS — F411 Generalized anxiety disorder: Secondary | ICD-10-CM | POA: Diagnosis not present

## 2021-06-23 DIAGNOSIS — M85851 Other specified disorders of bone density and structure, right thigh: Secondary | ICD-10-CM | POA: Diagnosis not present

## 2021-06-23 DIAGNOSIS — M8589 Other specified disorders of bone density and structure, multiple sites: Secondary | ICD-10-CM | POA: Diagnosis not present

## 2021-06-23 DIAGNOSIS — M8588 Other specified disorders of bone density and structure, other site: Secondary | ICD-10-CM | POA: Diagnosis not present

## 2021-07-01 DIAGNOSIS — R69 Illness, unspecified: Secondary | ICD-10-CM | POA: Diagnosis not present

## 2021-07-01 DIAGNOSIS — F411 Generalized anxiety disorder: Secondary | ICD-10-CM | POA: Diagnosis not present

## 2021-07-01 DIAGNOSIS — F331 Major depressive disorder, recurrent, moderate: Secondary | ICD-10-CM | POA: Diagnosis not present

## 2021-08-05 DIAGNOSIS — Z90721 Acquired absence of ovaries, unilateral: Secondary | ICD-10-CM | POA: Diagnosis not present

## 2021-08-05 DIAGNOSIS — D0511 Intraductal carcinoma in situ of right breast: Secondary | ICD-10-CM | POA: Diagnosis not present

## 2021-08-05 DIAGNOSIS — Z90710 Acquired absence of both cervix and uterus: Secondary | ICD-10-CM | POA: Diagnosis not present

## 2021-08-05 DIAGNOSIS — E669 Obesity, unspecified: Secondary | ICD-10-CM | POA: Diagnosis not present

## 2021-08-05 DIAGNOSIS — C50411 Malignant neoplasm of upper-outer quadrant of right female breast: Secondary | ICD-10-CM | POA: Diagnosis not present

## 2021-08-05 DIAGNOSIS — R768 Other specified abnormal immunological findings in serum: Secondary | ICD-10-CM | POA: Diagnosis not present

## 2021-08-12 DIAGNOSIS — D0511 Intraductal carcinoma in situ of right breast: Secondary | ICD-10-CM | POA: Diagnosis not present

## 2021-08-12 DIAGNOSIS — N3941 Urge incontinence: Secondary | ICD-10-CM | POA: Diagnosis not present

## 2021-08-12 DIAGNOSIS — Z0000 Encounter for general adult medical examination without abnormal findings: Secondary | ICD-10-CM | POA: Diagnosis not present

## 2021-08-12 DIAGNOSIS — M810 Age-related osteoporosis without current pathological fracture: Secondary | ICD-10-CM | POA: Diagnosis not present

## 2021-08-12 DIAGNOSIS — R69 Illness, unspecified: Secondary | ICD-10-CM | POA: Diagnosis not present

## 2021-08-12 DIAGNOSIS — R109 Unspecified abdominal pain: Secondary | ICD-10-CM | POA: Diagnosis not present

## 2021-08-12 DIAGNOSIS — R519 Headache, unspecified: Secondary | ICD-10-CM | POA: Diagnosis not present

## 2021-08-12 DIAGNOSIS — G4700 Insomnia, unspecified: Secondary | ICD-10-CM | POA: Diagnosis not present

## 2021-08-14 DIAGNOSIS — G43109 Migraine with aura, not intractable, without status migrainosus: Secondary | ICD-10-CM | POA: Diagnosis not present

## 2021-09-18 DIAGNOSIS — K6389 Other specified diseases of intestine: Secondary | ICD-10-CM | POA: Diagnosis not present

## 2021-09-18 DIAGNOSIS — N3281 Overactive bladder: Secondary | ICD-10-CM | POA: Diagnosis not present

## 2021-09-18 DIAGNOSIS — N952 Postmenopausal atrophic vaginitis: Secondary | ICD-10-CM | POA: Diagnosis not present

## 2021-10-06 DIAGNOSIS — H5203 Hypermetropia, bilateral: Secondary | ICD-10-CM | POA: Diagnosis not present

## 2021-10-06 DIAGNOSIS — H25013 Cortical age-related cataract, bilateral: Secondary | ICD-10-CM | POA: Diagnosis not present

## 2021-10-07 DIAGNOSIS — G8929 Other chronic pain: Secondary | ICD-10-CM | POA: Diagnosis not present

## 2021-10-07 DIAGNOSIS — F4321 Adjustment disorder with depressed mood: Secondary | ICD-10-CM | POA: Diagnosis not present

## 2021-10-07 DIAGNOSIS — G43909 Migraine, unspecified, not intractable, without status migrainosus: Secondary | ICD-10-CM | POA: Diagnosis not present

## 2021-10-07 DIAGNOSIS — M8580 Other specified disorders of bone density and structure, unspecified site: Secondary | ICD-10-CM | POA: Diagnosis not present

## 2021-10-07 DIAGNOSIS — M810 Age-related osteoporosis without current pathological fracture: Secondary | ICD-10-CM | POA: Diagnosis not present

## 2021-10-07 DIAGNOSIS — M1990 Unspecified osteoarthritis, unspecified site: Secondary | ICD-10-CM | POA: Diagnosis not present

## 2021-10-07 DIAGNOSIS — R69 Illness, unspecified: Secondary | ICD-10-CM | POA: Diagnosis not present

## 2021-10-07 DIAGNOSIS — E663 Overweight: Secondary | ICD-10-CM | POA: Diagnosis not present

## 2021-10-07 DIAGNOSIS — J301 Allergic rhinitis due to pollen: Secondary | ICD-10-CM | POA: Diagnosis not present

## 2021-10-07 DIAGNOSIS — F411 Generalized anxiety disorder: Secondary | ICD-10-CM | POA: Diagnosis not present

## 2021-10-07 DIAGNOSIS — I951 Orthostatic hypotension: Secondary | ICD-10-CM | POA: Diagnosis not present

## 2021-10-07 DIAGNOSIS — I499 Cardiac arrhythmia, unspecified: Secondary | ICD-10-CM | POA: Diagnosis not present

## 2021-10-08 DIAGNOSIS — M25512 Pain in left shoulder: Secondary | ICD-10-CM | POA: Diagnosis not present

## 2021-10-08 DIAGNOSIS — M25511 Pain in right shoulder: Secondary | ICD-10-CM | POA: Diagnosis not present

## 2021-10-08 DIAGNOSIS — M67911 Unspecified disorder of synovium and tendon, right shoulder: Secondary | ICD-10-CM | POA: Diagnosis not present

## 2021-10-08 DIAGNOSIS — M67912 Unspecified disorder of synovium and tendon, left shoulder: Secondary | ICD-10-CM | POA: Diagnosis not present

## 2021-10-09 DIAGNOSIS — M170 Bilateral primary osteoarthritis of knee: Secondary | ICD-10-CM | POA: Diagnosis not present

## 2021-10-09 DIAGNOSIS — M25562 Pain in left knee: Secondary | ICD-10-CM | POA: Diagnosis not present

## 2021-10-09 DIAGNOSIS — M25561 Pain in right knee: Secondary | ICD-10-CM | POA: Diagnosis not present

## 2021-11-09 DIAGNOSIS — M170 Bilateral primary osteoarthritis of knee: Secondary | ICD-10-CM | POA: Diagnosis not present

## 2021-11-16 DIAGNOSIS — M170 Bilateral primary osteoarthritis of knee: Secondary | ICD-10-CM | POA: Diagnosis not present

## 2021-11-19 DIAGNOSIS — N6322 Unspecified lump in the left breast, upper inner quadrant: Secondary | ICD-10-CM | POA: Diagnosis not present

## 2021-11-19 DIAGNOSIS — Z9011 Acquired absence of right breast and nipple: Secondary | ICD-10-CM | POA: Diagnosis not present

## 2021-11-19 DIAGNOSIS — D0511 Intraductal carcinoma in situ of right breast: Secondary | ICD-10-CM | POA: Diagnosis not present

## 2021-11-19 DIAGNOSIS — R922 Inconclusive mammogram: Secondary | ICD-10-CM | POA: Diagnosis not present

## 2021-11-19 DIAGNOSIS — Z923 Personal history of irradiation: Secondary | ICD-10-CM | POA: Diagnosis not present

## 2021-11-20 DIAGNOSIS — R69 Illness, unspecified: Secondary | ICD-10-CM | POA: Diagnosis not present

## 2021-11-20 DIAGNOSIS — G4700 Insomnia, unspecified: Secondary | ICD-10-CM | POA: Diagnosis not present

## 2021-11-20 DIAGNOSIS — M810 Age-related osteoporosis without current pathological fracture: Secondary | ICD-10-CM | POA: Diagnosis not present

## 2021-11-25 DIAGNOSIS — Z881 Allergy status to other antibiotic agents status: Secondary | ICD-10-CM | POA: Diagnosis not present

## 2021-11-25 DIAGNOSIS — C50411 Malignant neoplasm of upper-outer quadrant of right female breast: Secondary | ICD-10-CM | POA: Diagnosis not present

## 2021-11-25 DIAGNOSIS — Z79899 Other long term (current) drug therapy: Secondary | ICD-10-CM | POA: Diagnosis not present

## 2021-11-25 DIAGNOSIS — Z882 Allergy status to sulfonamides status: Secondary | ICD-10-CM | POA: Diagnosis not present

## 2021-11-25 DIAGNOSIS — Z803 Family history of malignant neoplasm of breast: Secondary | ICD-10-CM | POA: Diagnosis not present

## 2021-11-25 DIAGNOSIS — Z170 Estrogen receptor positive status [ER+]: Secondary | ICD-10-CM | POA: Diagnosis not present

## 2021-11-25 DIAGNOSIS — D0511 Intraductal carcinoma in situ of right breast: Secondary | ICD-10-CM | POA: Diagnosis not present

## 2021-11-26 DIAGNOSIS — M170 Bilateral primary osteoarthritis of knee: Secondary | ICD-10-CM | POA: Diagnosis not present

## 2021-12-02 DIAGNOSIS — M810 Age-related osteoporosis without current pathological fracture: Secondary | ICD-10-CM | POA: Diagnosis not present

## 2021-12-02 DIAGNOSIS — D0511 Intraductal carcinoma in situ of right breast: Secondary | ICD-10-CM | POA: Diagnosis not present

## 2021-12-02 DIAGNOSIS — C50411 Malignant neoplasm of upper-outer quadrant of right female breast: Secondary | ICD-10-CM | POA: Diagnosis not present

## 2021-12-16 DIAGNOSIS — R69 Illness, unspecified: Secondary | ICD-10-CM | POA: Diagnosis not present

## 2021-12-16 DIAGNOSIS — F4321 Adjustment disorder with depressed mood: Secondary | ICD-10-CM | POA: Diagnosis not present

## 2021-12-16 DIAGNOSIS — F331 Major depressive disorder, recurrent, moderate: Secondary | ICD-10-CM | POA: Diagnosis not present

## 2021-12-16 DIAGNOSIS — Z658 Other specified problems related to psychosocial circumstances: Secondary | ICD-10-CM | POA: Diagnosis not present

## 2021-12-16 DIAGNOSIS — F410 Panic disorder [episodic paroxysmal anxiety] without agoraphobia: Secondary | ICD-10-CM | POA: Diagnosis not present

## 2021-12-16 DIAGNOSIS — F411 Generalized anxiety disorder: Secondary | ICD-10-CM | POA: Diagnosis not present

## 2022-01-06 DIAGNOSIS — C50411 Malignant neoplasm of upper-outer quadrant of right female breast: Secondary | ICD-10-CM | POA: Diagnosis not present

## 2022-01-06 DIAGNOSIS — Z170 Estrogen receptor positive status [ER+]: Secondary | ICD-10-CM | POA: Diagnosis not present

## 2022-02-24 DIAGNOSIS — G43109 Migraine with aura, not intractable, without status migrainosus: Secondary | ICD-10-CM | POA: Diagnosis not present

## 2022-02-24 DIAGNOSIS — G43009 Migraine without aura, not intractable, without status migrainosus: Secondary | ICD-10-CM | POA: Diagnosis not present

## 2022-03-04 DIAGNOSIS — L820 Inflamed seborrheic keratosis: Secondary | ICD-10-CM | POA: Diagnosis not present

## 2022-03-04 DIAGNOSIS — L821 Other seborrheic keratosis: Secondary | ICD-10-CM | POA: Diagnosis not present

## 2022-03-04 DIAGNOSIS — D1801 Hemangioma of skin and subcutaneous tissue: Secondary | ICD-10-CM | POA: Diagnosis not present

## 2022-03-04 DIAGNOSIS — Z1283 Encounter for screening for malignant neoplasm of skin: Secondary | ICD-10-CM | POA: Diagnosis not present

## 2022-03-04 DIAGNOSIS — D485 Neoplasm of uncertain behavior of skin: Secondary | ICD-10-CM | POA: Diagnosis not present

## 2022-03-26 DIAGNOSIS — G4700 Insomnia, unspecified: Secondary | ICD-10-CM | POA: Diagnosis not present

## 2022-03-26 DIAGNOSIS — R69 Illness, unspecified: Secondary | ICD-10-CM | POA: Diagnosis not present

## 2022-03-26 DIAGNOSIS — M810 Age-related osteoporosis without current pathological fracture: Secondary | ICD-10-CM | POA: Diagnosis not present

## 2022-04-06 DIAGNOSIS — R634 Abnormal weight loss: Secondary | ICD-10-CM | POA: Diagnosis not present

## 2022-04-06 DIAGNOSIS — R350 Frequency of micturition: Secondary | ICD-10-CM | POA: Diagnosis not present

## 2022-04-29 DIAGNOSIS — F4320 Adjustment disorder, unspecified: Secondary | ICD-10-CM | POA: Diagnosis not present

## 2022-05-05 DIAGNOSIS — F4320 Adjustment disorder, unspecified: Secondary | ICD-10-CM | POA: Diagnosis not present

## 2022-05-12 DIAGNOSIS — F410 Panic disorder [episodic paroxysmal anxiety] without agoraphobia: Secondary | ICD-10-CM | POA: Diagnosis not present

## 2022-05-12 DIAGNOSIS — F3341 Major depressive disorder, recurrent, in partial remission: Secondary | ICD-10-CM | POA: Diagnosis not present

## 2022-05-12 DIAGNOSIS — F4321 Adjustment disorder with depressed mood: Secondary | ICD-10-CM | POA: Diagnosis not present

## 2022-05-12 DIAGNOSIS — R69 Illness, unspecified: Secondary | ICD-10-CM | POA: Diagnosis not present

## 2022-05-12 DIAGNOSIS — F411 Generalized anxiety disorder: Secondary | ICD-10-CM | POA: Diagnosis not present

## 2022-05-14 DIAGNOSIS — F4320 Adjustment disorder, unspecified: Secondary | ICD-10-CM | POA: Diagnosis not present

## 2022-06-11 DIAGNOSIS — F4320 Adjustment disorder, unspecified: Secondary | ICD-10-CM | POA: Diagnosis not present

## 2022-06-16 DIAGNOSIS — D0511 Intraductal carcinoma in situ of right breast: Secondary | ICD-10-CM | POA: Diagnosis not present

## 2022-06-16 DIAGNOSIS — Z9011 Acquired absence of right breast and nipple: Secondary | ICD-10-CM | POA: Diagnosis not present

## 2022-06-16 DIAGNOSIS — R928 Other abnormal and inconclusive findings on diagnostic imaging of breast: Secondary | ICD-10-CM | POA: Diagnosis not present

## 2022-06-17 DIAGNOSIS — F4320 Adjustment disorder, unspecified: Secondary | ICD-10-CM | POA: Diagnosis not present

## 2022-06-18 DIAGNOSIS — D0511 Intraductal carcinoma in situ of right breast: Secondary | ICD-10-CM | POA: Diagnosis not present

## 2022-06-18 DIAGNOSIS — J069 Acute upper respiratory infection, unspecified: Secondary | ICD-10-CM | POA: Diagnosis not present

## 2022-06-18 DIAGNOSIS — B9689 Other specified bacterial agents as the cause of diseases classified elsewhere: Secondary | ICD-10-CM | POA: Diagnosis not present

## 2022-06-18 DIAGNOSIS — N6002 Solitary cyst of left breast: Secondary | ICD-10-CM | POA: Diagnosis not present

## 2022-06-18 DIAGNOSIS — Z853 Personal history of malignant neoplasm of breast: Secondary | ICD-10-CM | POA: Diagnosis not present

## 2022-06-18 DIAGNOSIS — C50411 Malignant neoplasm of upper-outer quadrant of right female breast: Secondary | ICD-10-CM | POA: Diagnosis not present

## 2022-06-23 DIAGNOSIS — D0511 Intraductal carcinoma in situ of right breast: Secondary | ICD-10-CM | POA: Diagnosis not present

## 2022-06-23 DIAGNOSIS — Z170 Estrogen receptor positive status [ER+]: Secondary | ICD-10-CM | POA: Diagnosis not present

## 2022-07-28 DIAGNOSIS — M67912 Unspecified disorder of synovium and tendon, left shoulder: Secondary | ICD-10-CM | POA: Diagnosis not present

## 2022-08-17 DIAGNOSIS — F4320 Adjustment disorder, unspecified: Secondary | ICD-10-CM | POA: Diagnosis not present

## 2022-08-18 DIAGNOSIS — K5730 Diverticulosis of large intestine without perforation or abscess without bleeding: Secondary | ICD-10-CM | POA: Diagnosis not present

## 2022-08-18 DIAGNOSIS — Z1211 Encounter for screening for malignant neoplasm of colon: Secondary | ICD-10-CM | POA: Diagnosis not present

## 2022-08-18 DIAGNOSIS — R69 Illness, unspecified: Secondary | ICD-10-CM | POA: Diagnosis not present

## 2022-08-18 DIAGNOSIS — K641 Second degree hemorrhoids: Secondary | ICD-10-CM | POA: Diagnosis not present

## 2022-08-25 DIAGNOSIS — F4320 Adjustment disorder, unspecified: Secondary | ICD-10-CM | POA: Diagnosis not present

## 2022-08-30 DIAGNOSIS — G43109 Migraine with aura, not intractable, without status migrainosus: Secondary | ICD-10-CM | POA: Diagnosis not present

## 2022-08-30 DIAGNOSIS — G43009 Migraine without aura, not intractable, without status migrainosus: Secondary | ICD-10-CM | POA: Diagnosis not present

## 2022-08-31 DIAGNOSIS — M810 Age-related osteoporosis without current pathological fracture: Secondary | ICD-10-CM | POA: Diagnosis not present

## 2022-08-31 DIAGNOSIS — R69 Illness, unspecified: Secondary | ICD-10-CM | POA: Diagnosis not present

## 2022-08-31 DIAGNOSIS — G4700 Insomnia, unspecified: Secondary | ICD-10-CM | POA: Diagnosis not present

## 2022-09-23 ENCOUNTER — Telehealth: Payer: Self-pay

## 2022-09-23 NOTE — Patient Outreach (Signed)
  Care Coordination   09/23/2022 Name: Julie Kaiser MRN: [id number] DOB: [birth year]   Care Coordination Outreach Attempts:  An unsuccessful telephone outreach was attempted today to offer the patient information about available care coordination services as a benefit of their health plan.   Follow Up Plan:  Additional outreach attempts will be made to offer the patient care coordination information and services.   Encounter Outcome:  No Answer   Care Coordination Interventions:  No, not indicated    Peter Garter RN, BSN,CCM, CDE Care Management Coordinator [facility] [phone]

## 2022-09-27 ENCOUNTER — Telehealth: Payer: Self-pay

## 2022-09-27 NOTE — Patient Instructions (Signed)
Visit Information  Thank you for taking time to visit with me today. Please don't hesitate to contact me if I can be of assistance to you.   Following are the goals we discussed today:   Goals Addressed             This Visit's Progress    COMPLETED: Care Coordination Activities - no follow up required       Interventions Today    Flowsheet Row Most Recent Value  General Interventions   General Interventions Discussed/Reviewed General Interventions Discussed, Doctor Visits  Doctor Visits Discussed/Reviewed Doctor Visits Discussed, Annual Wellness Visits  Exercise Interventions   Exercise Discussed/Reviewed Exercise Discussed  Education Interventions   Education Provided Provided Education  Provided Verbal Education On Other  [care coordination services]               If you are experiencing a Mental Health or [place] or need someone to talk to, please call the Suicide and Crisis Lifeline: 988 call the [place] National Suicide Prevention Lifeline: (661)287-0112 or TTY: 939-444-7778 TTY (910) 002-7822) to talk to a trained counselor call 1-800-273-TALK (toll free, 24 hour hotline) call 911   Patient verbalizes understanding of instructions and care plan provided today and agrees to view in [place]. Active MyChart status and patient understanding of how to access instructions and care plan via MyChart confirmed with patient.     No further follow up required:    Peter Garter RN, Jackquline [place], [facility] [phone]

## 2022-09-27 NOTE — Patient Outreach (Signed)
  Care Coordination   Initial Visit Note   09/27/2022 Name: Julie Kaiser MRN: [id number] DOB: Feb 09, 1951  Julie Kaiser is a 72 y.o. year old female who sees Seward Carol, MD for primary care. I spoke with  Lewanda Rife by phone today.  What matters to the patients health and wellness today?  No concerns today    Goals Addressed             This Visit's Progress    COMPLETED: Care Coordination Activities - no follow up required       Interventions Today    Flowsheet Row Most Recent Value  General Interventions   General Interventions Discussed/Reviewed General Interventions Discussed, Doctor Visits  Doctor Visits Discussed/Reviewed Doctor Visits Discussed, Annual Wellness Visits  Exercise Interventions   Exercise Discussed/Reviewed Exercise Discussed  Education Interventions   Education Provided Provided Education  Provided Verbal Education On Other  [care coordination services]              SDOH assessments and interventions completed:  Yes  SDOH Interventions Today    Flowsheet Row Most Recent Value  SDOH Interventions   Food Insecurity Interventions Intervention Not Indicated  Housing Interventions Intervention Not Indicated  Transportation Interventions Intervention Not Indicated  Utilities Interventions Intervention Not Indicated        Care Coordination Interventions:  Yes, provided   Follow up plan: No further intervention required.   Encounter Outcome:  Pt. Visit Completed  Peter Garter RN, BSN,CCM, CDE Care Management Coordinator [facility] [phone]

## 2022-10-05 DIAGNOSIS — N3281 Overactive bladder: Secondary | ICD-10-CM | POA: Diagnosis not present

## 2022-10-05 DIAGNOSIS — N952 Postmenopausal atrophic vaginitis: Secondary | ICD-10-CM | POA: Diagnosis not present

## 2022-10-18 DIAGNOSIS — F4320 Adjustment disorder, unspecified: Secondary | ICD-10-CM | POA: Diagnosis not present

## 2022-10-26 DIAGNOSIS — F4320 Adjustment disorder, unspecified: Secondary | ICD-10-CM | POA: Diagnosis not present

## 2022-11-02 DIAGNOSIS — M67912 Unspecified disorder of synovium and tendon, left shoulder: Secondary | ICD-10-CM | POA: Diagnosis not present

## 2022-11-03 DIAGNOSIS — F4320 Adjustment disorder, unspecified: Secondary | ICD-10-CM | POA: Diagnosis not present

## 2022-11-10 DIAGNOSIS — Z0000 Encounter for general adult medical examination without abnormal findings: Secondary | ICD-10-CM | POA: Diagnosis not present

## 2022-11-10 DIAGNOSIS — R69 Illness, unspecified: Secondary | ICD-10-CM | POA: Diagnosis not present

## 2022-11-10 DIAGNOSIS — D0511 Intraductal carcinoma in situ of right breast: Secondary | ICD-10-CM | POA: Diagnosis not present

## 2022-11-10 DIAGNOSIS — M810 Age-related osteoporosis without current pathological fracture: Secondary | ICD-10-CM | POA: Diagnosis not present

## 2022-11-11 DIAGNOSIS — R69 Illness, unspecified: Secondary | ICD-10-CM | POA: Diagnosis not present

## 2022-11-11 DIAGNOSIS — F411 Generalized anxiety disorder: Secondary | ICD-10-CM | POA: Diagnosis not present

## 2022-11-11 DIAGNOSIS — F4320 Adjustment disorder, unspecified: Secondary | ICD-10-CM | POA: Diagnosis not present

## 2022-11-11 DIAGNOSIS — F3341 Major depressive disorder, recurrent, in partial remission: Secondary | ICD-10-CM | POA: Diagnosis not present

## 2022-11-11 DIAGNOSIS — F410 Panic disorder [episodic paroxysmal anxiety] without agoraphobia: Secondary | ICD-10-CM | POA: Diagnosis not present

## 2022-11-11 DIAGNOSIS — Z113 Encounter for screening for infections with a predominantly sexual mode of transmission: Secondary | ICD-10-CM | POA: Diagnosis not present

## 2022-11-11 DIAGNOSIS — F4321 Adjustment disorder with depressed mood: Secondary | ICD-10-CM | POA: Diagnosis not present

## 2022-12-07 DIAGNOSIS — H25013 Cortical age-related cataract, bilateral: Secondary | ICD-10-CM | POA: Diagnosis not present

## 2022-12-07 DIAGNOSIS — H5203 Hypermetropia, bilateral: Secondary | ICD-10-CM | POA: Diagnosis not present

## 2022-12-07 DIAGNOSIS — F4320 Adjustment disorder, unspecified: Secondary | ICD-10-CM | POA: Diagnosis not present

## 2022-12-09 DIAGNOSIS — L820 Inflamed seborrheic keratosis: Secondary | ICD-10-CM | POA: Diagnosis not present

## 2022-12-15 DIAGNOSIS — R922 Inconclusive mammogram: Secondary | ICD-10-CM | POA: Diagnosis not present

## 2022-12-15 DIAGNOSIS — R92323 Mammographic fibroglandular density, bilateral breasts: Secondary | ICD-10-CM | POA: Diagnosis not present

## 2022-12-15 DIAGNOSIS — G43909 Migraine, unspecified, not intractable, without status migrainosus: Secondary | ICD-10-CM | POA: Diagnosis not present

## 2022-12-15 DIAGNOSIS — N3941 Urge incontinence: Secondary | ICD-10-CM | POA: Diagnosis not present

## 2022-12-15 DIAGNOSIS — F329 Major depressive disorder, single episode, unspecified: Secondary | ICD-10-CM | POA: Diagnosis not present

## 2022-12-15 DIAGNOSIS — Z853 Personal history of malignant neoplasm of breast: Secondary | ICD-10-CM | POA: Diagnosis not present

## 2022-12-15 DIAGNOSIS — J301 Allergic rhinitis due to pollen: Secondary | ICD-10-CM | POA: Diagnosis not present

## 2022-12-15 DIAGNOSIS — M1990 Unspecified osteoarthritis, unspecified site: Secondary | ICD-10-CM | POA: Diagnosis not present

## 2022-12-15 DIAGNOSIS — M62838 Other muscle spasm: Secondary | ICD-10-CM | POA: Diagnosis not present

## 2022-12-15 DIAGNOSIS — I499 Cardiac arrhythmia, unspecified: Secondary | ICD-10-CM | POA: Diagnosis not present

## 2022-12-15 DIAGNOSIS — H269 Unspecified cataract: Secondary | ICD-10-CM | POA: Diagnosis not present

## 2022-12-15 DIAGNOSIS — F419 Anxiety disorder, unspecified: Secondary | ICD-10-CM | POA: Diagnosis not present

## 2022-12-15 DIAGNOSIS — M8580 Other specified disorders of bone density and structure, unspecified site: Secondary | ICD-10-CM | POA: Diagnosis not present

## 2022-12-15 DIAGNOSIS — C50411 Malignant neoplasm of upper-outer quadrant of right female breast: Secondary | ICD-10-CM | POA: Diagnosis not present

## 2022-12-16 DIAGNOSIS — M25512 Pain in left shoulder: Secondary | ICD-10-CM | POA: Diagnosis not present

## 2022-12-16 DIAGNOSIS — R6889 Other general symptoms and signs: Secondary | ICD-10-CM | POA: Diagnosis not present

## 2022-12-16 DIAGNOSIS — M67912 Unspecified disorder of synovium and tendon, left shoulder: Secondary | ICD-10-CM | POA: Diagnosis not present

## 2022-12-21 DIAGNOSIS — F4320 Adjustment disorder, unspecified: Secondary | ICD-10-CM | POA: Diagnosis not present

## 2022-12-22 DIAGNOSIS — Z170 Estrogen receptor positive status [ER+]: Secondary | ICD-10-CM | POA: Diagnosis not present

## 2022-12-22 DIAGNOSIS — Z853 Personal history of malignant neoplasm of breast: Secondary | ICD-10-CM | POA: Diagnosis not present

## 2022-12-22 DIAGNOSIS — D0511 Intraductal carcinoma in situ of right breast: Secondary | ICD-10-CM | POA: Diagnosis not present

## 2022-12-22 DIAGNOSIS — Z08 Encounter for follow-up examination after completed treatment for malignant neoplasm: Secondary | ICD-10-CM | POA: Diagnosis not present

## 2023-01-06 DIAGNOSIS — M67912 Unspecified disorder of synovium and tendon, left shoulder: Secondary | ICD-10-CM | POA: Diagnosis not present

## 2023-01-06 DIAGNOSIS — M25512 Pain in left shoulder: Secondary | ICD-10-CM | POA: Diagnosis not present

## 2023-01-06 DIAGNOSIS — R6889 Other general symptoms and signs: Secondary | ICD-10-CM | POA: Diagnosis not present

## 2023-01-18 DIAGNOSIS — F4320 Adjustment disorder, unspecified: Secondary | ICD-10-CM | POA: Diagnosis not present

## 2023-01-20 DIAGNOSIS — M25512 Pain in left shoulder: Secondary | ICD-10-CM | POA: Diagnosis not present

## 2023-01-20 DIAGNOSIS — R6889 Other general symptoms and signs: Secondary | ICD-10-CM | POA: Diagnosis not present

## 2023-01-20 DIAGNOSIS — M67912 Unspecified disorder of synovium and tendon, left shoulder: Secondary | ICD-10-CM | POA: Diagnosis not present

## 2023-01-27 DIAGNOSIS — R6889 Other general symptoms and signs: Secondary | ICD-10-CM | POA: Diagnosis not present

## 2023-01-27 DIAGNOSIS — M67912 Unspecified disorder of synovium and tendon, left shoulder: Secondary | ICD-10-CM | POA: Diagnosis not present

## 2023-01-27 DIAGNOSIS — F4320 Adjustment disorder, unspecified: Secondary | ICD-10-CM | POA: Diagnosis not present

## 2023-01-27 DIAGNOSIS — M25512 Pain in left shoulder: Secondary | ICD-10-CM | POA: Diagnosis not present

## 2023-02-09 DIAGNOSIS — F4320 Adjustment disorder, unspecified: Secondary | ICD-10-CM | POA: Diagnosis not present

## 2023-02-11 DIAGNOSIS — F411 Generalized anxiety disorder: Secondary | ICD-10-CM | POA: Diagnosis not present

## 2023-02-11 DIAGNOSIS — F4323 Adjustment disorder with mixed anxiety and depressed mood: Secondary | ICD-10-CM | POA: Diagnosis not present

## 2023-02-11 DIAGNOSIS — Z658 Other specified problems related to psychosocial circumstances: Secondary | ICD-10-CM | POA: Diagnosis not present

## 2023-02-11 DIAGNOSIS — F410 Panic disorder [episodic paroxysmal anxiety] without agoraphobia: Secondary | ICD-10-CM | POA: Diagnosis not present

## 2023-02-11 DIAGNOSIS — F4321 Adjustment disorder with depressed mood: Secondary | ICD-10-CM | POA: Diagnosis not present

## 2023-02-17 DIAGNOSIS — M25512 Pain in left shoulder: Secondary | ICD-10-CM | POA: Diagnosis not present

## 2023-02-17 DIAGNOSIS — M67912 Unspecified disorder of synovium and tendon, left shoulder: Secondary | ICD-10-CM | POA: Diagnosis not present

## 2023-02-17 DIAGNOSIS — R6889 Other general symptoms and signs: Secondary | ICD-10-CM | POA: Diagnosis not present

## 2023-03-15 DIAGNOSIS — F4320 Adjustment disorder, unspecified: Secondary | ICD-10-CM | POA: Diagnosis not present

## 2023-04-01 DIAGNOSIS — C182 Malignant neoplasm of ascending colon: Secondary | ICD-10-CM | POA: Diagnosis not present

## 2023-04-12 DIAGNOSIS — M67912 Unspecified disorder of synovium and tendon, left shoulder: Secondary | ICD-10-CM | POA: Diagnosis not present

## 2023-04-12 DIAGNOSIS — M5441 Lumbago with sciatica, right side: Secondary | ICD-10-CM | POA: Diagnosis not present

## 2023-04-14 DIAGNOSIS — F4320 Adjustment disorder, unspecified: Secondary | ICD-10-CM | POA: Diagnosis not present

## 2023-05-05 DIAGNOSIS — F4320 Adjustment disorder, unspecified: Secondary | ICD-10-CM | POA: Diagnosis not present

## 2023-05-12 DIAGNOSIS — F410 Panic disorder [episodic paroxysmal anxiety] without agoraphobia: Secondary | ICD-10-CM | POA: Diagnosis not present

## 2023-05-12 DIAGNOSIS — F4323 Adjustment disorder with mixed anxiety and depressed mood: Secondary | ICD-10-CM | POA: Diagnosis not present

## 2023-05-12 DIAGNOSIS — Z658 Other specified problems related to psychosocial circumstances: Secondary | ICD-10-CM | POA: Diagnosis not present

## 2023-05-12 DIAGNOSIS — F4321 Adjustment disorder with depressed mood: Secondary | ICD-10-CM | POA: Diagnosis not present

## 2023-05-12 DIAGNOSIS — F411 Generalized anxiety disorder: Secondary | ICD-10-CM | POA: Diagnosis not present

## 2023-05-24 DIAGNOSIS — F4320 Adjustment disorder, unspecified: Secondary | ICD-10-CM | POA: Diagnosis not present

## 2023-06-13 DIAGNOSIS — M5116 Intervertebral disc disorders with radiculopathy, lumbar region: Secondary | ICD-10-CM | POA: Diagnosis not present

## 2023-06-13 DIAGNOSIS — M19012 Primary osteoarthritis, left shoulder: Secondary | ICD-10-CM | POA: Diagnosis not present

## 2023-06-13 DIAGNOSIS — M4726 Other spondylosis with radiculopathy, lumbar region: Secondary | ICD-10-CM | POA: Diagnosis not present

## 2023-06-13 DIAGNOSIS — M5441 Lumbago with sciatica, right side: Secondary | ICD-10-CM | POA: Diagnosis not present

## 2023-06-13 DIAGNOSIS — M67912 Unspecified disorder of synovium and tendon, left shoulder: Secondary | ICD-10-CM | POA: Diagnosis not present

## 2023-06-13 DIAGNOSIS — M48061 Spinal stenosis, lumbar region without neurogenic claudication: Secondary | ICD-10-CM | POA: Diagnosis not present

## 2023-06-13 DIAGNOSIS — M5115 Intervertebral disc disorders with radiculopathy, thoracolumbar region: Secondary | ICD-10-CM | POA: Diagnosis not present

## 2023-06-13 DIAGNOSIS — M75122 Complete rotator cuff tear or rupture of left shoulder, not specified as traumatic: Secondary | ICD-10-CM | POA: Diagnosis not present

## 2023-06-14 DIAGNOSIS — F4321 Adjustment disorder with depressed mood: Secondary | ICD-10-CM | POA: Diagnosis not present

## 2023-06-14 DIAGNOSIS — F410 Panic disorder [episodic paroxysmal anxiety] without agoraphobia: Secondary | ICD-10-CM | POA: Diagnosis not present

## 2023-06-14 DIAGNOSIS — F4323 Adjustment disorder with mixed anxiety and depressed mood: Secondary | ICD-10-CM | POA: Diagnosis not present

## 2023-06-14 DIAGNOSIS — F411 Generalized anxiety disorder: Secondary | ICD-10-CM | POA: Diagnosis not present

## 2023-06-14 DIAGNOSIS — Z658 Other specified problems related to psychosocial circumstances: Secondary | ICD-10-CM | POA: Diagnosis not present

## 2023-06-15 DIAGNOSIS — F4320 Adjustment disorder, unspecified: Secondary | ICD-10-CM | POA: Diagnosis not present

## 2023-06-16 DIAGNOSIS — M5441 Lumbago with sciatica, right side: Secondary | ICD-10-CM | POA: Diagnosis not present

## 2023-06-16 DIAGNOSIS — M75122 Complete rotator cuff tear or rupture of left shoulder, not specified as traumatic: Secondary | ICD-10-CM | POA: Diagnosis not present

## 2023-06-29 DIAGNOSIS — M5416 Radiculopathy, lumbar region: Secondary | ICD-10-CM | POA: Diagnosis not present

## 2023-07-07 DIAGNOSIS — M25512 Pain in left shoulder: Secondary | ICD-10-CM | POA: Diagnosis not present

## 2023-07-08 DIAGNOSIS — G8918 Other acute postprocedural pain: Secondary | ICD-10-CM | POA: Diagnosis not present

## 2023-07-08 DIAGNOSIS — M7542 Impingement syndrome of left shoulder: Secondary | ICD-10-CM | POA: Diagnosis not present

## 2023-07-08 DIAGNOSIS — M19012 Primary osteoarthritis, left shoulder: Secondary | ICD-10-CM | POA: Diagnosis not present

## 2023-07-08 DIAGNOSIS — M75122 Complete rotator cuff tear or rupture of left shoulder, not specified as traumatic: Secondary | ICD-10-CM | POA: Diagnosis not present

## 2023-08-04 DIAGNOSIS — F4320 Adjustment disorder, unspecified: Secondary | ICD-10-CM | POA: Diagnosis not present

## 2023-08-08 DIAGNOSIS — M7918 Myalgia, other site: Secondary | ICD-10-CM | POA: Diagnosis not present

## 2023-08-13 DIAGNOSIS — Z008 Encounter for other general examination: Secondary | ICD-10-CM | POA: Diagnosis not present

## 2023-08-16 DIAGNOSIS — M75122 Complete rotator cuff tear or rupture of left shoulder, not specified as traumatic: Secondary | ICD-10-CM | POA: Diagnosis not present

## 2023-08-16 DIAGNOSIS — M25512 Pain in left shoulder: Secondary | ICD-10-CM | POA: Diagnosis not present

## 2023-08-16 DIAGNOSIS — Z4789 Encounter for other orthopedic aftercare: Secondary | ICD-10-CM | POA: Diagnosis not present

## 2023-08-18 DIAGNOSIS — M25512 Pain in left shoulder: Secondary | ICD-10-CM | POA: Diagnosis not present

## 2023-08-18 DIAGNOSIS — R6889 Other general symptoms and signs: Secondary | ICD-10-CM | POA: Diagnosis not present

## 2023-08-18 DIAGNOSIS — M75122 Complete rotator cuff tear or rupture of left shoulder, not specified as traumatic: Secondary | ICD-10-CM | POA: Diagnosis not present

## 2023-08-18 DIAGNOSIS — Z4789 Encounter for other orthopedic aftercare: Secondary | ICD-10-CM | POA: Diagnosis not present

## 2023-08-25 DIAGNOSIS — M25512 Pain in left shoulder: Secondary | ICD-10-CM | POA: Diagnosis not present

## 2023-08-25 DIAGNOSIS — Z4789 Encounter for other orthopedic aftercare: Secondary | ICD-10-CM | POA: Diagnosis not present

## 2023-08-25 DIAGNOSIS — M75122 Complete rotator cuff tear or rupture of left shoulder, not specified as traumatic: Secondary | ICD-10-CM | POA: Diagnosis not present

## 2023-09-22 DIAGNOSIS — F4320 Adjustment disorder, unspecified: Secondary | ICD-10-CM | POA: Diagnosis not present

## 2023-09-27 DIAGNOSIS — F410 Panic disorder [episodic paroxysmal anxiety] without agoraphobia: Secondary | ICD-10-CM | POA: Diagnosis not present

## 2023-09-27 DIAGNOSIS — M25512 Pain in left shoulder: Secondary | ICD-10-CM | POA: Diagnosis not present

## 2023-09-27 DIAGNOSIS — F411 Generalized anxiety disorder: Secondary | ICD-10-CM | POA: Diagnosis not present

## 2023-09-27 DIAGNOSIS — Z658 Other specified problems related to psychosocial circumstances: Secondary | ICD-10-CM | POA: Diagnosis not present

## 2023-09-27 DIAGNOSIS — F4323 Adjustment disorder with mixed anxiety and depressed mood: Secondary | ICD-10-CM | POA: Diagnosis not present

## 2023-09-27 DIAGNOSIS — F4321 Adjustment disorder with depressed mood: Secondary | ICD-10-CM | POA: Diagnosis not present

## 2023-09-29 DIAGNOSIS — M25512 Pain in left shoulder: Secondary | ICD-10-CM | POA: Diagnosis not present

## 2023-10-04 DIAGNOSIS — G8929 Other chronic pain: Secondary | ICD-10-CM | POA: Diagnosis not present

## 2023-10-04 DIAGNOSIS — Z9889 Other specified postprocedural states: Secondary | ICD-10-CM | POA: Diagnosis not present

## 2023-10-04 DIAGNOSIS — M25512 Pain in left shoulder: Secondary | ICD-10-CM | POA: Diagnosis not present

## 2023-10-11 DIAGNOSIS — G8929 Other chronic pain: Secondary | ICD-10-CM | POA: Diagnosis not present

## 2023-10-11 DIAGNOSIS — Z9889 Other specified postprocedural states: Secondary | ICD-10-CM | POA: Diagnosis not present

## 2023-10-11 DIAGNOSIS — M25512 Pain in left shoulder: Secondary | ICD-10-CM | POA: Diagnosis not present

## 2023-10-12 DIAGNOSIS — M25511 Pain in right shoulder: Secondary | ICD-10-CM | POA: Diagnosis not present

## 2023-10-18 DIAGNOSIS — Z9889 Other specified postprocedural states: Secondary | ICD-10-CM | POA: Diagnosis not present

## 2023-10-18 DIAGNOSIS — M25512 Pain in left shoulder: Secondary | ICD-10-CM | POA: Diagnosis not present

## 2023-10-18 DIAGNOSIS — G8929 Other chronic pain: Secondary | ICD-10-CM | POA: Diagnosis not present

## 2023-10-31 DIAGNOSIS — M75122 Complete rotator cuff tear or rupture of left shoulder, not specified as traumatic: Secondary | ICD-10-CM | POA: Diagnosis not present

## 2023-11-10 DIAGNOSIS — G43109 Migraine with aura, not intractable, without status migrainosus: Secondary | ICD-10-CM | POA: Diagnosis not present

## 2023-12-07 DIAGNOSIS — H16223 Keratoconjunctivitis sicca, not specified as Sjogren's, bilateral: Secondary | ICD-10-CM | POA: Diagnosis not present

## 2023-12-07 DIAGNOSIS — H25013 Cortical age-related cataract, bilateral: Secondary | ICD-10-CM | POA: Diagnosis not present

## 2023-12-07 DIAGNOSIS — H5203 Hypermetropia, bilateral: Secondary | ICD-10-CM | POA: Diagnosis not present

## 2023-12-12 DIAGNOSIS — M7541 Impingement syndrome of right shoulder: Secondary | ICD-10-CM | POA: Diagnosis not present

## 2023-12-12 DIAGNOSIS — M75122 Complete rotator cuff tear or rupture of left shoulder, not specified as traumatic: Secondary | ICD-10-CM | POA: Diagnosis not present

## 2023-12-16 DIAGNOSIS — M7541 Impingement syndrome of right shoulder: Secondary | ICD-10-CM | POA: Diagnosis not present

## 2023-12-16 DIAGNOSIS — M75122 Complete rotator cuff tear or rupture of left shoulder, not specified as traumatic: Secondary | ICD-10-CM | POA: Diagnosis not present

## 2023-12-16 DIAGNOSIS — Z1231 Encounter for screening mammogram for malignant neoplasm of breast: Secondary | ICD-10-CM | POA: Diagnosis not present

## 2023-12-16 DIAGNOSIS — R92323 Mammographic fibroglandular density, bilateral breasts: Secondary | ICD-10-CM | POA: Diagnosis not present

## 2023-12-20 DIAGNOSIS — D0511 Intraductal carcinoma in situ of right breast: Secondary | ICD-10-CM | POA: Diagnosis not present

## 2023-12-20 DIAGNOSIS — C50411 Malignant neoplasm of upper-outer quadrant of right female breast: Secondary | ICD-10-CM | POA: Diagnosis not present

## 2023-12-22 DIAGNOSIS — L814 Other melanin hyperpigmentation: Secondary | ICD-10-CM | POA: Diagnosis not present

## 2023-12-22 DIAGNOSIS — L821 Other seborrheic keratosis: Secondary | ICD-10-CM | POA: Diagnosis not present

## 2023-12-22 DIAGNOSIS — Z1283 Encounter for screening for malignant neoplasm of skin: Secondary | ICD-10-CM | POA: Diagnosis not present

## 2023-12-27 DIAGNOSIS — F4321 Adjustment disorder with depressed mood: Secondary | ICD-10-CM | POA: Diagnosis not present

## 2023-12-27 DIAGNOSIS — F4323 Adjustment disorder with mixed anxiety and depressed mood: Secondary | ICD-10-CM | POA: Diagnosis not present

## 2023-12-27 DIAGNOSIS — F411 Generalized anxiety disorder: Secondary | ICD-10-CM | POA: Diagnosis not present

## 2023-12-27 DIAGNOSIS — F410 Panic disorder [episodic paroxysmal anxiety] without agoraphobia: Secondary | ICD-10-CM | POA: Diagnosis not present

## 2023-12-27 DIAGNOSIS — Z658 Other specified problems related to psychosocial circumstances: Secondary | ICD-10-CM | POA: Diagnosis not present

## 2023-12-28 DIAGNOSIS — S46011A Strain of muscle(s) and tendon(s) of the rotator cuff of right shoulder, initial encounter: Secondary | ICD-10-CM | POA: Diagnosis not present

## 2024-02-01 DIAGNOSIS — Z8679 Personal history of other diseases of the circulatory system: Secondary | ICD-10-CM | POA: Diagnosis not present

## 2024-02-01 DIAGNOSIS — E538 Deficiency of other specified B group vitamins: Secondary | ICD-10-CM | POA: Diagnosis not present

## 2024-02-01 DIAGNOSIS — R4189 Other symptoms and signs involving cognitive functions and awareness: Secondary | ICD-10-CM | POA: Diagnosis not present

## 2024-02-01 DIAGNOSIS — Z658 Other specified problems related to psychosocial circumstances: Secondary | ICD-10-CM | POA: Diagnosis not present

## 2024-02-01 DIAGNOSIS — F4329 Adjustment disorder with other symptoms: Secondary | ICD-10-CM | POA: Diagnosis not present

## 2024-02-14 DIAGNOSIS — R4189 Other symptoms and signs involving cognitive functions and awareness: Secondary | ICD-10-CM | POA: Diagnosis not present

## 2024-02-14 DIAGNOSIS — Z8679 Personal history of other diseases of the circulatory system: Secondary | ICD-10-CM | POA: Diagnosis not present

## 2024-02-19 DIAGNOSIS — F4320 Adjustment disorder, unspecified: Secondary | ICD-10-CM | POA: Diagnosis not present

## 2024-03-26 DIAGNOSIS — R419 Unspecified symptoms and signs involving cognitive functions and awareness: Secondary | ICD-10-CM | POA: Diagnosis not present

## 2024-03-26 DIAGNOSIS — F4329 Adjustment disorder with other symptoms: Secondary | ICD-10-CM | POA: Diagnosis not present

## 2024-03-29 DIAGNOSIS — L821 Other seborrheic keratosis: Secondary | ICD-10-CM | POA: Diagnosis not present

## 2024-03-29 DIAGNOSIS — L814 Other melanin hyperpigmentation: Secondary | ICD-10-CM | POA: Diagnosis not present

## 2024-03-29 DIAGNOSIS — L258 Unspecified contact dermatitis due to other agents: Secondary | ICD-10-CM | POA: Diagnosis not present

## 2024-03-30 DIAGNOSIS — F4329 Adjustment disorder with other symptoms: Secondary | ICD-10-CM | POA: Diagnosis not present

## 2024-03-30 DIAGNOSIS — R419 Unspecified symptoms and signs involving cognitive functions and awareness: Secondary | ICD-10-CM | POA: Diagnosis not present

## 2024-04-05 DIAGNOSIS — G8918 Other acute postprocedural pain: Secondary | ICD-10-CM | POA: Diagnosis not present

## 2024-04-05 DIAGNOSIS — M75121 Complete rotator cuff tear or rupture of right shoulder, not specified as traumatic: Secondary | ICD-10-CM | POA: Diagnosis not present

## 2024-04-05 DIAGNOSIS — S46011A Strain of muscle(s) and tendon(s) of the rotator cuff of right shoulder, initial encounter: Secondary | ICD-10-CM | POA: Diagnosis not present

## 2024-04-05 DIAGNOSIS — M7541 Impingement syndrome of right shoulder: Secondary | ICD-10-CM | POA: Diagnosis not present

## 2024-04-05 DIAGNOSIS — M19011 Primary osteoarthritis, right shoulder: Secondary | ICD-10-CM | POA: Diagnosis not present

## 2024-04-25 DIAGNOSIS — S299XXA Unspecified injury of thorax, initial encounter: Secondary | ICD-10-CM | POA: Diagnosis not present

## 2024-04-25 DIAGNOSIS — S20211A Contusion of right front wall of thorax, initial encounter: Secondary | ICD-10-CM | POA: Diagnosis not present

## 2024-04-25 DIAGNOSIS — S0011XA Contusion of right eyelid and periocular area, initial encounter: Secondary | ICD-10-CM | POA: Diagnosis not present

## 2024-04-25 DIAGNOSIS — S0990XA Unspecified injury of head, initial encounter: Secondary | ICD-10-CM | POA: Diagnosis not present

## 2024-04-25 DIAGNOSIS — S0511XA Contusion of eyeball and orbital tissues, right eye, initial encounter: Secondary | ICD-10-CM | POA: Diagnosis not present

## 2024-04-26 DIAGNOSIS — M25511 Pain in right shoulder: Secondary | ICD-10-CM | POA: Diagnosis not present

## 2024-05-01 DIAGNOSIS — F4321 Adjustment disorder with depressed mood: Secondary | ICD-10-CM | POA: Diagnosis not present

## 2024-05-01 DIAGNOSIS — F411 Generalized anxiety disorder: Secondary | ICD-10-CM | POA: Diagnosis not present

## 2024-05-01 DIAGNOSIS — F4323 Adjustment disorder with mixed anxiety and depressed mood: Secondary | ICD-10-CM | POA: Diagnosis not present

## 2024-05-01 DIAGNOSIS — Z658 Other specified problems related to psychosocial circumstances: Secondary | ICD-10-CM | POA: Diagnosis not present

## 2024-05-01 DIAGNOSIS — F410 Panic disorder [episodic paroxysmal anxiety] without agoraphobia: Secondary | ICD-10-CM | POA: Diagnosis not present

## 2024-05-21 DIAGNOSIS — M25511 Pain in right shoulder: Secondary | ICD-10-CM | POA: Diagnosis not present

## 2024-05-23 DIAGNOSIS — M25511 Pain in right shoulder: Secondary | ICD-10-CM | POA: Diagnosis not present

## 2024-06-05 DIAGNOSIS — H43391 Other vitreous opacities, right eye: Secondary | ICD-10-CM | POA: Diagnosis not present

## 2024-06-05 DIAGNOSIS — H16223 Keratoconjunctivitis sicca, not specified as Sjogren's, bilateral: Secondary | ICD-10-CM | POA: Diagnosis not present

## 2024-06-05 DIAGNOSIS — H25013 Cortical age-related cataract, bilateral: Secondary | ICD-10-CM | POA: Diagnosis not present

## 2024-06-12 DIAGNOSIS — M25511 Pain in right shoulder: Secondary | ICD-10-CM | POA: Diagnosis not present

## 2024-06-15 DIAGNOSIS — M25511 Pain in right shoulder: Secondary | ICD-10-CM | POA: Diagnosis not present

## 2024-06-19 DIAGNOSIS — F4321 Adjustment disorder with depressed mood: Secondary | ICD-10-CM | POA: Diagnosis not present

## 2024-06-19 DIAGNOSIS — M25511 Pain in right shoulder: Secondary | ICD-10-CM | POA: Diagnosis not present

## 2024-06-19 DIAGNOSIS — F4323 Adjustment disorder with mixed anxiety and depressed mood: Secondary | ICD-10-CM | POA: Diagnosis not present

## 2024-06-19 DIAGNOSIS — Z658 Other specified problems related to psychosocial circumstances: Secondary | ICD-10-CM | POA: Diagnosis not present

## 2024-06-19 DIAGNOSIS — F410 Panic disorder [episodic paroxysmal anxiety] without agoraphobia: Secondary | ICD-10-CM | POA: Diagnosis not present

## 2024-06-19 DIAGNOSIS — F411 Generalized anxiety disorder: Secondary | ICD-10-CM | POA: Diagnosis not present

## 2024-06-22 DIAGNOSIS — M25511 Pain in right shoulder: Secondary | ICD-10-CM | POA: Diagnosis not present

## 2024-06-26 DIAGNOSIS — F4323 Adjustment disorder with mixed anxiety and depressed mood: Secondary | ICD-10-CM | POA: Diagnosis not present

## 2024-06-26 DIAGNOSIS — F4321 Adjustment disorder with depressed mood: Secondary | ICD-10-CM | POA: Diagnosis not present

## 2024-06-26 DIAGNOSIS — M25511 Pain in right shoulder: Secondary | ICD-10-CM | POA: Diagnosis not present

## 2024-06-26 DIAGNOSIS — Z658 Other specified problems related to psychosocial circumstances: Secondary | ICD-10-CM | POA: Diagnosis not present

## 2024-06-26 DIAGNOSIS — F410 Panic disorder [episodic paroxysmal anxiety] without agoraphobia: Secondary | ICD-10-CM | POA: Diagnosis not present

## 2024-06-27 DIAGNOSIS — H53129 Transient visual loss, unspecified eye: Secondary | ICD-10-CM | POA: Diagnosis not present

## 2024-07-03 DIAGNOSIS — H16223 Keratoconjunctivitis sicca, not specified as Sjogren's, bilateral: Secondary | ICD-10-CM | POA: Diagnosis not present

## 2024-07-03 DIAGNOSIS — D0511 Intraductal carcinoma in situ of right breast: Secondary | ICD-10-CM | POA: Diagnosis not present

## 2024-07-03 DIAGNOSIS — H25013 Cortical age-related cataract, bilateral: Secondary | ICD-10-CM | POA: Diagnosis not present

## 2024-07-03 DIAGNOSIS — R0789 Other chest pain: Secondary | ICD-10-CM | POA: Diagnosis not present

## 2024-07-03 DIAGNOSIS — H43391 Other vitreous opacities, right eye: Secondary | ICD-10-CM | POA: Diagnosis not present

## 2024-07-03 DIAGNOSIS — C50411 Malignant neoplasm of upper-outer quadrant of right female breast: Secondary | ICD-10-CM | POA: Diagnosis not present

## 2024-07-03 DIAGNOSIS — R0781 Pleurodynia: Secondary | ICD-10-CM | POA: Diagnosis not present

## 2024-07-03 DIAGNOSIS — M810 Age-related osteoporosis without current pathological fracture: Secondary | ICD-10-CM | POA: Diagnosis not present

## 2024-07-03 DIAGNOSIS — H43811 Vitreous degeneration, right eye: Secondary | ICD-10-CM | POA: Diagnosis not present

## 2024-07-12 DIAGNOSIS — D1801 Hemangioma of skin and subcutaneous tissue: Secondary | ICD-10-CM | POA: Diagnosis not present

## 2024-07-12 DIAGNOSIS — L820 Inflamed seborrheic keratosis: Secondary | ICD-10-CM | POA: Diagnosis not present

## 2024-07-12 DIAGNOSIS — L814 Other melanin hyperpigmentation: Secondary | ICD-10-CM | POA: Diagnosis not present
